# Patient Record
Sex: Female | Born: 1990 | Race: White | Hispanic: No | Marital: Single | Smoking: Current every day smoker
Health system: Southern US, Community
[De-identification: ages and names within clinical notes are randomized; demographics above are authoritative.]

## PROBLEM LIST (undated history)

## (undated) DIAGNOSIS — R519 Headache, unspecified: Secondary | ICD-10-CM

## (undated) DIAGNOSIS — R51 Headache: Secondary | ICD-10-CM

## (undated) DIAGNOSIS — K56609 Unspecified intestinal obstruction, unspecified as to partial versus complete obstruction: Secondary | ICD-10-CM

## (undated) HISTORY — PX: WISDOM TOOTH EXTRACTION: SHX21

---

## 2014-12-10 ENCOUNTER — Emergency Department (HOSPITAL_COMMUNITY)
Admission: EM | Admit: 2014-12-10 | Discharge: 2014-12-11 | Disposition: A | Discharge status: Home / Self Care | Payer: Self-pay | Attending: Emergency Medicine | Admitting: Emergency Medicine

## 2014-12-10 ENCOUNTER — Encounter (HOSPITAL_COMMUNITY): Payer: Self-pay | Admitting: Emergency Medicine

## 2014-12-10 DIAGNOSIS — K2920 Alcoholic gastritis without bleeding: Secondary | ICD-10-CM | POA: Insufficient documentation

## 2014-12-10 DIAGNOSIS — Z720 Tobacco use: Secondary | ICD-10-CM | POA: Insufficient documentation

## 2014-12-10 DIAGNOSIS — E860 Dehydration: Secondary | ICD-10-CM | POA: Insufficient documentation

## 2014-12-10 DIAGNOSIS — R740 Nonspecific elevation of levels of transaminase and lactic acid dehydrogenase [LDH]: Secondary | ICD-10-CM | POA: Insufficient documentation

## 2014-12-10 LAB — COMPREHENSIVE METABOLIC PANEL
ALK PHOS: 61 U/L (ref 38–126)
ALT: 31 U/L (ref 14–54)
AST: 39 U/L (ref 15–41)
Albumin: 4.2 g/dL (ref 3.5–5.0)
Anion gap: 17 — ABNORMAL HIGH (ref 5–15)
BILIRUBIN TOTAL: 1.3 mg/dL — AB (ref 0.3–1.2)
BUN: 9 mg/dL (ref 6–20)
CALCIUM: 9.3 mg/dL (ref 8.9–10.3)
CHLORIDE: 104 mmol/L (ref 101–111)
CO2: 16 mmol/L — ABNORMAL LOW (ref 22–32)
CREATININE: 0.71 mg/dL (ref 0.44–1.00)
Glucose, Bld: 100 mg/dL — ABNORMAL HIGH (ref 65–99)
Potassium: 4.4 mmol/L (ref 3.5–5.1)
Sodium: 137 mmol/L (ref 135–145)
TOTAL PROTEIN: 7.5 g/dL (ref 6.5–8.1)

## 2014-12-10 LAB — URINALYSIS, ROUTINE W REFLEX MICROSCOPIC
BILIRUBIN URINE: NEGATIVE
Glucose, UA: NEGATIVE mg/dL
Hgb urine dipstick: NEGATIVE
LEUKOCYTES UA: NEGATIVE
NITRITE: NEGATIVE
PROTEIN: NEGATIVE mg/dL
Specific Gravity, Urine: 1.023 (ref 1.005–1.030)
UROBILINOGEN UA: 0.2 mg/dL (ref 0.0–1.0)
pH: 6 (ref 5.0–8.0)

## 2014-12-10 LAB — LIPASE, BLOOD: LIPASE: 14 U/L — AB (ref 22–51)

## 2014-12-10 LAB — I-STAT CG4 LACTIC ACID, ED: LACTIC ACID, VENOUS: 2.24 mmol/L — AB (ref 0.5–2.0)

## 2014-12-10 LAB — CBC
HCT: 45 % (ref 36.0–46.0)
Hemoglobin: 15.6 g/dL — ABNORMAL HIGH (ref 12.0–15.0)
MCH: 31.9 pg (ref 26.0–34.0)
MCHC: 34.7 g/dL (ref 30.0–36.0)
MCV: 92 fL (ref 78.0–100.0)
PLATELETS: 309 10*3/uL (ref 150–400)
RBC: 4.89 MIL/uL (ref 3.87–5.11)
RDW: 12.4 % (ref 11.5–15.5)
WBC: 16.3 10*3/uL — AB (ref 4.0–10.5)

## 2014-12-10 LAB — I-STAT BETA HCG BLOOD, ED (MC, WL, AP ONLY): I-stat hCG, quantitative: <5 m[IU]/mL (ref <5)

## 2014-12-10 MED ORDER — FAMOTIDINE IN NACL 20-0.9 MG/50ML-% IV SOLN
20.0000 mg | Freq: Once | INTRAVENOUS | Status: AC
  Administered 2014-12-10: 20 mg via INTRAVENOUS
  Filled 2014-12-10: qty 50

## 2014-12-10 MED ORDER — HYDROMORPHONE HCL 1 MG/ML IJ SOLN
0.5000 mg | Freq: Once | INTRAMUSCULAR | Status: AC
  Administered 2014-12-10: 0.5 mg via INTRAVENOUS
  Filled 2014-12-10: qty 1

## 2014-12-10 MED ORDER — ONDANSETRON HCL 4 MG/2ML IJ SOLN
4.0000 mg | Freq: Once | INTRAMUSCULAR | Status: AC
  Administered 2014-12-10: 4 mg via INTRAVENOUS
  Filled 2014-12-10: qty 2

## 2014-12-10 MED ORDER — GI COCKTAIL ~~LOC~~
30.0000 mL | Freq: Once | ORAL | Status: AC
  Administered 2014-12-10: 30 mL via ORAL
  Filled 2014-12-10: qty 30

## 2014-12-10 MED ORDER — SODIUM CHLORIDE 0.9 % IV BOLUS (SEPSIS)
1000.0000 mL | Freq: Once | INTRAVENOUS | Status: AC
  Administered 2014-12-10: 1000 mL via INTRAVENOUS

## 2014-12-10 NOTE — ED Provider Notes (Addendum)
CSN: [id number]     Arrival date & time 12/10/14  2009 History   First MD Initiated Contact with Patient 12/10/14 2047     Chief Complaint  Patient presents with  . Abdominal Pain     (Consider location/radiation/quality/duration/timing/severity/associated sxs/prior Treatment) Patient is a 24 y.o. female presenting with abdominal pain. The history is provided by the patient.  Abdominal Pain Pain location:  Epigastric and periumbilical Pain quality: cramping, sharp and shooting   Pain radiates to:  Does not radiate Pain severity:  Severe Onset quality:  Sudden Duration:  12 hours Timing:  Constant Progression:  Worsening Chronicity:  New Context: alcohol use   Context comment:  Patient states she drinks heavily 4 times a week and last drank heavily last night Relieved by:  Nothing Worsened by:  Eating and vomiting Ineffective treatments: Patient went to urgent care earlier and at that time was given a shot of Phenergan without improvement in her pain. Associated symptoms: anorexia, nausea and vomiting   Associated symptoms: no diarrhea, no dysuria, no fever, no hematemesis, no hematuria, no shortness of breath, no vaginal bleeding and no vaginal discharge   Risk factors: alcohol abuse   Risk factors: not pregnant   Risk factors comment:  States she drinks heavily to the point of becoming inebriated 4 times a week   History reviewed. No pertinent past medical history. History reviewed. No pertinent past surgical history. No family history on file. Social History  Substance Use Topics  . Smoking status: Current Every Day Smoker  . Smokeless tobacco: None  . Alcohol Use: Yes   OB History    No data available     Review of Systems  Constitutional: Negative for fever.  Respiratory: Negative for shortness of breath.   Gastrointestinal: Positive for nausea, vomiting, abdominal pain and anorexia. Negative for diarrhea and hematemesis.  Genitourinary: Negative for dysuria,  hematuria, vaginal bleeding and vaginal discharge.  All other systems reviewed and are negative.     Allergies  Review of patient's allergies indicates not on file.  Home Medications   Prior to Admission medications   Not on File   BP 129/93 mmHg  Pulse 71  Temp(Src) 97.5 F (36.4 C) (Oral)  Resp 18  Ht  (1.676 m)  Wt 145 lb (65.772 kg)  BMI 23.41 kg/m2  SpO2 98%  LMP 11/12/2014 Physical Exam  Constitutional: She is oriented to person, place, and time. She appears well-developed and well-nourished. She appears distressed.  Appears uncomfortable  HENT:  Head: Normocephalic and atraumatic.  Mouth/Throat: Mucous membranes are dry.  Eyes: EOM are normal. Pupils are equal, round, and reactive to light.  Cardiovascular: Normal rate, regular rhythm, normal heart sounds and intact distal pulses.  Exam reveals no friction rub.   No murmur heard. Pulmonary/Chest: Effort normal and breath sounds normal. She has no wheezes. She has no rales. She exhibits no tenderness.  Abdominal: Soft. Bowel sounds are normal. She exhibits no distension. There is tenderness in the epigastric area and periumbilical area. There is no rebound and no guarding.    Musculoskeletal: Normal range of motion. She exhibits no tenderness.  No edema  Neurological: She is alert and oriented to person, place, and time. No cranial nerve deficit.  Skin: Skin is warm and dry. No rash noted.  Psychiatric: She has a normal mood and affect. Her behavior is normal.  Nursing note and vitals reviewed.   ED Course  Procedures (including critical care time) Labs Review Labs Reviewed  LIPASE, BLOOD - Abnormal; Notable for the following:    Lipase 14 (*)    All other components within normal limits  COMPREHENSIVE METABOLIC PANEL - Abnormal; Notable for the following:    CO2 16 (*)    Glucose, Bld 100 (*)    Total Bilirubin 1.3 (*)    Anion gap 17 (*)    All other components within normal limits  CBC -  Abnormal; Notable for the following:    WBC 16.3 (*)    Hemoglobin 15.6 (*)    All other components within normal limits  URINALYSIS, ROUTINE W REFLEX MICROSCOPIC (NOT AT [facility]) - Abnormal; Notable for the following:    APPearance CLOUDY (*)    Ketones, ur >80 (*)    All other components within normal limits  I-STAT CG4 LACTIC ACID, ED - Abnormal; Notable for the following:    Lactic Acid, Venous 2.24 (*)    All other components within normal limits  I-STAT BETA HCG BLOOD, ED ([facility], [facility], [facility] ONLY)    Imaging Review No results found. I have personally reviewed and evaluated these images and lab results as part of my medical decision-making.   EKG Interpretation None      MDM   Final diagnoses:  Alcoholic gastritis  Dehydration   Patient is a 24 year old female with a history of IBS but no other acute medical problems who takes no medications on a regular basis and presents today with severe epigastric and umbilical pain that's been ongoing now for almost 12 hours. She drinks heavily on a regular basis and last drank numerous drinks last night prior to going to bed. She has had vomiting but no diarrhea. No fever. Patient was seen in urgent care and then Phenergan and had an ultrasound and x-ray which they do not know what the results were. Also had blood work and also results were unknown. Patient went home but continued to have abdominal pain and returned here. She is hemodynamically stable however lactate is slightly elevated at 2.24. She does appear dehydrated and concern for alcoholic pancreatitis versus gastritis. She is given IV fluids, pain and nausea medication as well as Pepcid.  Low suspicion for appendicitis, cholecystitis, ectopic pregnancy, ovarian torsion, renal stone.  CBC, CMP, lipase, UA, hCG pending   10:13 PM Leukocytosis of 16,000 which is most likely stress reaction, normal CMP, lipase and hCG. Patient appears to be dehydrated but otherwise has normal labs. Feel  most likely this is alcoholic gastritis. On reevaluation patient is feeling much better. She is requesting something to drink. Will give a second liter and patient was going to start Prilosec 2 tablets per day for 2 weeks.  12:03 AM Pt toleratling po's and feeling much better.  D/ced home with carafate and zofran.    Gwyneth Sprout, MD 12/11/14 7741  Gwyneth Sprout, MD 12/11/14 4239

## 2014-12-10 NOTE — ED Notes (Signed)
C/o sharp mid abd pain, abd distention, nausea, vomiting, and diarrhea since noon today.  Denies urinary complaints.  Seen at [facility] today and given Rx for phenergan.

## 2014-12-11 MED ORDER — ONDANSETRON 8 MG PO TBDP: 8.0000 mg | ORAL_TABLET | Freq: Three times a day (TID) | ORAL | Status: AC | PRN

## 2014-12-11 MED ORDER — HYDROCODONE-ACETAMINOPHEN 5-325 MG PO TABS: 1.0000 | ORAL_TABLET | ORAL | Status: DC | PRN

## 2014-12-11 MED ORDER — SUCRALFATE 1 G PO TABS: 1.0000 g | ORAL_TABLET | Freq: Three times a day (TID) | ORAL | Status: DC

## 2014-12-11 MED ORDER — HYDROMORPHONE HCL 1 MG/ML IJ SOLN
0.5000 mg | Freq: Once | INTRAMUSCULAR | Status: AC
  Administered 2014-12-11: 0.5 mg via INTRAVENOUS
  Filled 2014-12-11: qty 1

## 2016-07-12 ENCOUNTER — Inpatient Hospital Stay (HOSPITAL_COMMUNITY)
Admission: EM | Admit: 2016-07-12 | Discharge: 2016-07-15 | DRG: 390 | Disposition: A | Discharge status: Home / Self Care | Payer: No Typology Code available for payment source | Attending: Family Medicine | Admitting: Family Medicine

## 2016-07-12 ENCOUNTER — Encounter (HOSPITAL_COMMUNITY): Payer: Self-pay | Admitting: Emergency Medicine

## 2016-07-12 ENCOUNTER — Emergency Department (HOSPITAL_COMMUNITY): Payer: No Typology Code available for payment source

## 2016-07-12 ENCOUNTER — Inpatient Hospital Stay (HOSPITAL_COMMUNITY): Payer: No Typology Code available for payment source

## 2016-07-12 DIAGNOSIS — D72829 Elevated white blood cell count, unspecified: Secondary | ICD-10-CM | POA: Diagnosis present

## 2016-07-12 DIAGNOSIS — N760 Acute vaginitis: Secondary | ICD-10-CM | POA: Diagnosis not present

## 2016-07-12 DIAGNOSIS — Z0189 Encounter for other specified special examinations: Secondary | ICD-10-CM | POA: Diagnosis not present

## 2016-07-12 DIAGNOSIS — E860 Dehydration: Secondary | ICD-10-CM | POA: Diagnosis present

## 2016-07-12 DIAGNOSIS — K565 Intestinal adhesions [bands], unspecified as to partial versus complete obstruction: Principal | ICD-10-CM | POA: Diagnosis present

## 2016-07-12 DIAGNOSIS — R1110 Vomiting, unspecified: Secondary | ICD-10-CM | POA: Diagnosis present

## 2016-07-12 DIAGNOSIS — K56609 Unspecified intestinal obstruction, unspecified as to partial versus complete obstruction: Secondary | ICD-10-CM

## 2016-07-12 DIAGNOSIS — Z79899 Other long term (current) drug therapy: Secondary | ICD-10-CM | POA: Diagnosis not present

## 2016-07-12 HISTORY — DX: Unspecified intestinal obstruction, unspecified as to partial versus complete obstruction: K56.609

## 2016-07-12 LAB — CBC
HCT: 43.8 % (ref 36.0–46.0)
Hemoglobin: 14.5 g/dL (ref 12.0–15.0)
MCH: 29.6 pg (ref 26.0–34.0)
MCHC: 33.1 g/dL (ref 30.0–36.0)
MCV: 89.4 fL (ref 78.0–100.0)
Platelets: 363 10*3/uL (ref 150–400)
RBC: 4.9 MIL/uL (ref 3.87–5.11)
RDW: 12.6 % (ref 11.5–15.5)
WBC: 12.7 10*3/uL — ABNORMAL HIGH (ref 4.0–10.5)

## 2016-07-12 LAB — WET PREP, GENITAL
CLUE CELLS WET PREP: NONE SEEN
Sperm: NONE SEEN
TRICH WET PREP: NONE SEEN
Yeast Wet Prep HPF POC: NONE SEEN

## 2016-07-12 LAB — COMPREHENSIVE METABOLIC PANEL
ALK PHOS: 67 U/L (ref 38–126)
ALT: 47 U/L (ref 14–54)
AST: 32 U/L (ref 15–41)
Albumin: 3.9 g/dL (ref 3.5–5.0)
Anion gap: 10 (ref 5–15)
BUN: 11 mg/dL (ref 6–20)
CALCIUM: 9.4 mg/dL (ref 8.9–10.3)
CO2: 23 mmol/L (ref 22–32)
CREATININE: 0.64 mg/dL (ref 0.44–1.00)
Chloride: 104 mmol/L (ref 101–111)
GFR calc Af Amer: >60 mL/min (ref >60)
Glucose, Bld: 110 mg/dL — ABNORMAL HIGH (ref 65–99)
Potassium: 4 mmol/L (ref 3.5–5.1)
SODIUM: 137 mmol/L (ref 135–145)
Total Bilirubin: 0.7 mg/dL (ref 0.3–1.2)
Total Protein: 7.6 g/dL (ref 6.5–8.1)

## 2016-07-12 LAB — URINALYSIS, ROUTINE W REFLEX MICROSCOPIC
BILIRUBIN URINE: NEGATIVE
GLUCOSE, UA: NEGATIVE mg/dL
HGB URINE DIPSTICK: NEGATIVE
KETONES UR: 80 mg/dL — AB
Leukocytes, UA: NEGATIVE
Nitrite: NEGATIVE
PROTEIN: NEGATIVE mg/dL
Specific Gravity, Urine: 1.027 (ref 1.005–1.030)
pH: 5 (ref 5.0–8.0)

## 2016-07-12 LAB — I-STAT BETA HCG BLOOD, ED (MC, WL, AP ONLY)

## 2016-07-12 LAB — MAGNESIUM: Magnesium: 1.8 mg/dL (ref 1.7–2.4)

## 2016-07-12 LAB — LACTIC ACID, PLASMA: Lactic Acid, Venous: 1 mmol/L (ref 0.5–1.9)

## 2016-07-12 LAB — LIPASE, BLOOD: Lipase: 13 U/L (ref 11–51)

## 2016-07-12 MED ORDER — KCL IN DEXTROSE-NACL 20-5-0.9 MEQ/L-%-% IV SOLN
INTRAVENOUS | Status: DC
  Administered 2016-07-12 – 2016-07-15 (×5): via INTRAVENOUS
  Filled 2016-07-12 (×9): qty 1000

## 2016-07-12 MED ORDER — DIPHENHYDRAMINE HCL 50 MG/ML IJ SOLN
25.0000 mg | Freq: Once | INTRAMUSCULAR | Status: AC
  Administered 2016-07-12: 25 mg via INTRAVENOUS
  Filled 2016-07-12: qty 1

## 2016-07-12 MED ORDER — MORPHINE SULFATE (PF) 4 MG/ML IV SOLN: 4.0000 mg | Freq: Once | INTRAVENOUS | Status: DC

## 2016-07-12 MED ORDER — ONDANSETRON HCL 4 MG/2ML IJ SOLN
4.0000 mg | Freq: Four times a day (QID) | INTRAMUSCULAR | Status: DC | PRN
  Administered 2016-07-12 – 2016-07-14 (×3): 4 mg via INTRAVENOUS
  Filled 2016-07-12 (×3): qty 2

## 2016-07-12 MED ORDER — HYDROMORPHONE HCL 1 MG/ML IJ SOLN
0.5000 mg | Freq: Once | INTRAMUSCULAR | Status: AC
  Administered 2016-07-12: 0.5 mg via INTRAVENOUS
  Filled 2016-07-12: qty 1

## 2016-07-12 MED ORDER — SODIUM CHLORIDE 0.9 % IV BOLUS (SEPSIS)
1000.0000 mL | Freq: Once | INTRAVENOUS | Status: AC
  Administered 2016-07-12: 1000 mL via INTRAVENOUS

## 2016-07-12 MED ORDER — FENTANYL CITRATE (PF) 100 MCG/2ML IJ SOLN
25.0000 ug | INTRAMUSCULAR | Status: DC | PRN
  Administered 2016-07-12 – 2016-07-13 (×2): 25 ug via INTRAVENOUS
  Filled 2016-07-12 (×2): qty 2

## 2016-07-12 MED ORDER — DIATRIZOATE MEGLUMINE & SODIUM 66-10 % PO SOLN
90.0000 mL | Freq: Once | ORAL | Status: AC
  Administered 2016-07-12: 90 mL via NASOGASTRIC
  Filled 2016-07-12 (×2): qty 90

## 2016-07-12 MED ORDER — ONDANSETRON HCL 4 MG PO TABS: 4.0000 mg | ORAL_TABLET | Freq: Four times a day (QID) | ORAL | Status: DC | PRN

## 2016-07-12 MED ORDER — MORPHINE SULFATE (PF) 4 MG/ML IV SOLN
4.0000 mg | Freq: Once | INTRAVENOUS | Status: AC
  Administered 2016-07-12: 4 mg via INTRAVENOUS
  Filled 2016-07-12: qty 1

## 2016-07-12 MED ORDER — KETOROLAC TROMETHAMINE 15 MG/ML IJ SOLN
15.0000 mg | Freq: Four times a day (QID) | INTRAMUSCULAR | Status: DC | PRN
  Administered 2016-07-12 – 2016-07-14 (×2): 15 mg via INTRAVENOUS
  Filled 2016-07-12 (×2): qty 1

## 2016-07-12 MED ORDER — ONDANSETRON HCL 4 MG/2ML IJ SOLN
4.0000 mg | Freq: Once | INTRAMUSCULAR | Status: AC
  Administered 2016-07-12: 4 mg via INTRAVENOUS
  Filled 2016-07-12: qty 2

## 2016-07-12 MED ORDER — METRONIDAZOLE IN NACL 5-0.79 MG/ML-% IV SOLN: 500.0000 mg | Freq: Three times a day (TID) | INTRAVENOUS | Status: DC

## 2016-07-12 MED ORDER — ENOXAPARIN SODIUM 40 MG/0.4ML ~~LOC~~ SOLN
40.0000 mg | SUBCUTANEOUS | Status: DC
  Administered 2016-07-12 – 2016-07-13 (×2): 40 mg via SUBCUTANEOUS
  Filled 2016-07-12 (×2): qty 0.4

## 2016-07-12 MED ORDER — IOPAMIDOL (ISOVUE-300) INJECTION 61%
INTRAVENOUS | Status: AC
  Administered 2016-07-12: 100 mL
  Filled 2016-07-12: qty 100

## 2016-07-12 MED ORDER — LIDOCAINE VISCOUS 2 % MT SOLN
15.0000 mL | Freq: Once | OROMUCOSAL | Status: DC
  Filled 2016-07-12: qty 15

## 2016-07-12 NOTE — Progress Notes (Signed)
Pt admitted to the unit at 1652. Pt mental status is A&O x 4. Pt oriented to room, staff, and call bell. Skin is intact except where otherwise charted. Full assessment charted in [facility]. Call bell within reach. Visitor guidelines reviewed w/ pt and/or family.

## 2016-07-12 NOTE — ED Notes (Signed)
Pt taken to CT; no itching at injection sight as before with morphine. Instructed to let staff know if having any allergic reaction symptoms

## 2016-07-12 NOTE — ED Notes (Signed)
Pt. Ambulated to the bathroom, gait steady.  

## 2016-07-12 NOTE — ED Triage Notes (Signed)
Pt states at 12am today she started having generalized abdominal cramping and pain. Pt reports drinking 3 times a week beer and liquor when asked how much pt states, "more than she should".

## 2016-07-12 NOTE — ED Notes (Signed)
Report attempted x 1

## 2016-07-12 NOTE — ED Notes (Signed)
Pt tolerating NG tube well; no needs at this time; RN provided education on her NPO status and why important.

## 2016-07-12 NOTE — ED Notes (Signed)
MD informed pt has recurrence of pain.

## 2016-07-12 NOTE — ED Provider Notes (Signed)
[facility]-EMERGENCY DEPT Provider Note   CSN: [id number] Arrival date & time: 07/12/16  8756     History   Chief Complaint Chief Complaint  Patient presents with  . Abdominal Pain    HPI Stephanie Ware is a 26 y.o. female.  HPI Pt complains of abdominal pain.   She did drink a lot of alcohol on Tuesday.  However she felt fine most of Wednesday until last night around midnight.  The pain is coming and going.  Whenever she tried to eat or drink she vomits.  The pain is severe at times and all over her abdomen.  Some loose stools but not a lot.  She has vomited numerous times.  No abdominal surgeries. Normal menses.   History reviewed. No pertinent past medical history.  Patient Active Problem List   Diagnosis Date Noted  . SBO (small bowel obstruction) 07/12/2016  . Dehydration 07/12/2016  . Bacterial vaginosis 07/12/2016    History reviewed. No pertinent surgical history.  OB History    No data available       Home Medications    Prior to Admission medications   Medication Sig Start Date End Date Taking? Authorizing Provider  Loratadine (CLARITIN) 10 MG CAPS Take 10 mg by mouth daily.   Yes Historical Provider, MD  norethindrone-ethinyl estradiol-iron (MICROGESTIN FE,GILDESS FE,LOESTRIN FE) 1.5-30 MG-MCG tablet Take 1 tablet by mouth daily.   Yes Historical Provider, MD  Omega-3 Fatty Acids (FISH OIL) 1000 MG CPDR Take 1,000 mg by mouth daily.   Yes Historical Provider, MD  omeprazole (PRILOSEC) 40 MG capsule Take 40 mg by mouth daily.    Yes Historical Provider, MD  HYDROcodone-acetaminophen (NORCO/VICODIN) 5-325 MG per tablet Take 1-2 tablets by mouth every 4 (four) hours as needed. Patient not taking: Reported on 07/12/2016 12/11/14   Gwyneth Sprout, MD  ondansetron (ZOFRAN ODT) 8 MG disintegrating tablet Take 1 tablet (8 mg total) by mouth every 8 (eight) hours as needed for nausea or vomiting. Patient not taking: Reported on 07/12/2016 12/11/14   Gwyneth Sprout, MD    sucralfate (CARAFATE) 1 G tablet Take 1 tablet (1 g total) by mouth 4 (four) times daily -  with meals and at bedtime. Patient not taking: Reported on 07/12/2016 12/11/14   Gwyneth Sprout, MD    Family History No family history on file.  Social History Social History  Substance Use Topics  . Smoking status: Current Every Day Smoker  . Smokeless tobacco: Not on file  . Alcohol use Yes     Allergies   Patient has no known allergies.   Review of Systems Review of Systems  Constitutional: Negative for fever.  Gastrointestinal: Negative for abdominal pain and vomiting.  All other systems reviewed and are negative.    Physical Exam Updated Vital Signs BP 113/69   Pulse 87   Temp 98 F (36.7 C) (Oral)   Resp 17   Ht 5\' 6"  (1.676 m)   Wt 65.8 kg   LMP 06/28/2016   SpO2 100%   BMI 23.40 kg/m   Physical Exam  Constitutional: She appears well-developed and well-nourished. No distress.  HENT:  Head: Normocephalic and atraumatic.  Right Ear: External ear normal.  Left Ear: External ear normal.  Eyes: Conjunctivae are normal. Right eye exhibits no discharge. Left eye exhibits no discharge. No scleral icterus.  Neck: Neck supple. No tracheal deviation present.  Cardiovascular: Normal rate, regular rhythm and intact distal pulses.   Pulmonary/Chest: Effort normal and breath sounds normal. No  stridor. No respiratory distress. She has no wheezes. She has no rales.  Abdominal: Soft. Bowel sounds are normal. She exhibits no distension. There is tenderness in the suprapubic area. There is no rebound and no guarding.  Genitourinary: Uterus normal. Pelvic exam was performed with patient supine. There is no rash, tenderness, lesion or injury on the right labia. There is no rash, tenderness, lesion or injury on the left labia. Uterus is not tender. Cervix exhibits no motion tenderness, no discharge and no friability. Right adnexum displays no mass, no tenderness and no fullness. Left  adnexum displays no mass, no tenderness and no fullness. No erythema or tenderness in the vagina. No foreign body in the vagina. No signs of injury around the vagina. No vaginal discharge found.  Musculoskeletal: She exhibits no edema or tenderness.  Neurological: She is alert. She has normal strength. No cranial nerve deficit (no facial droop, extraocular movements intact, no slurred speech) or sensory deficit. She exhibits normal muscle tone. She displays no seizure activity. Coordination normal.  Skin: Skin is warm and dry. No rash noted.  Psychiatric: She has a normal mood and affect.  Nursing note and vitals reviewed.    ED Treatments / Results  Labs (all labs ordered are listed, but only abnormal results are displayed) Labs Reviewed  WET PREP, GENITAL - Abnormal; Notable for the following:       Result Value   WBC, Wet Prep HPF POC MANY (*)    All other components within normal limits  COMPREHENSIVE METABOLIC PANEL - Abnormal; Notable for the following:    Glucose, Bld 110 (*)    All other components within normal limits  CBC - Abnormal; Notable for the following:    WBC 12.7 (*)    All other components within normal limits  URINALYSIS, ROUTINE W REFLEX MICROSCOPIC - Abnormal; Notable for the following:    Ketones, ur 80 (*)    All other components within normal limits  LIPASE, BLOOD  RPR  HIV ANTIBODY (ROUTINE TESTING)  I-STAT BETA HCG BLOOD, ED ([facility], [facility], [facility] ONLY)  GC/CHLAMYDIA PROBE AMP ([place]) NOT AT [facility]    Radiology Ct Abdomen Pelvis W Contrast  Result Date: 07/12/2016 CLINICAL DATA:  Abdominal distention, cramps and belching for 1 day. EXAM: CT ABDOMEN AND PELVIS WITH CONTRAST TECHNIQUE: Multidetector CT imaging of the abdomen and pelvis was performed using the standard protocol following bolus administration of intravenous contrast. CONTRAST:  100 ml ISOVUE-300 IOPAMIDOL (ISOVUE-300) INJECTION 61% COMPARISON:  None. FINDINGS: Lower chest: Lung bases are clear. No  pleural or pericardial effusion. Hepatobiliary: No focal liver abnormality is seen. No gallstones, gallbladder wall thickening, or biliary dilatation. Pancreas: Unremarkable. No pancreatic ductal dilatation or surrounding inflammatory changes. Spleen: Normal in size without focal abnormality. Adrenals/Urinary Tract: Adrenal glands are unremarkable. Kidneys are normal, without renal calculi, focal lesion, or hydronephrosis. Bladder is unremarkable. Stomach/Bowel: There is dilatation of small bowel up to 3 cm with air-fluid levels present. Fecalized small bowel contents are seen in the right lower quadrant where a transition point is identified. Loops distal to the transition are completely decompressed. There is marked angulation about the transition point suggestive of adhesions. The terminal ileum is normal in appearance. The colon is largely decompressed but otherwise appears normal. The stomach and appendix are normal. No pneumatosis, portal venous gas or free intraperitoneal air is identified. Vascular/Lymphatic: No significant vascular findings are present. No enlarged abdominal or pelvic lymph nodes. Reproductive: Uterus and bilateral adnexa are unremarkable. Other: Small  volume of free pelvic fluid is noted. The patient has a small umbilical hernia. Musculoskeletal: Appear normal. IMPRESSION: The study is positive for small bowel obstruction with a transition point identified in the right lower quadrant. The appearance of the transition point is suggestive of adhesions. No CT evidence of inflammatory bowel disease is identified. The study is otherwise negative. Electronically Signed   By: Drusilla Kanner M.D.   On: 07/12/2016 12:19    Procedures Procedures (including critical care time)  Medications Ordered in ED Medications  lidocaine (XYLOCAINE) 2 % viscous mouth solution 15 mL (not administered)  dextrose 5 % and 0.9 % NaCl with KCl 20 mEq/L infusion (not administered)  metroNIDAZOLE (FLAGYL)  IVPB 500 mg (not administered)  diatrizoate meglumine-sodium (GASTROGRAFIN) 66-10 % solution 90 mL (not administered)  sodium chloride 0.9 % bolus 1,000 mL (0 mLs Intravenous Stopped 07/12/16 1144)  morphine 4 MG/ML injection 4 mg (4 mg Intravenous Given 07/12/16 1028)  ondansetron (ZOFRAN) injection 4 mg (4 mg Intravenous Given 07/12/16 1028)  diphenhydrAMINE (BENADRYL) injection 25 mg (25 mg Intravenous Given 07/12/16 1045)  iopamidol (ISOVUE-300) 61 % injection (100 mLs  Contrast Given 07/12/16 1155)  HYDROmorphone (DILAUDID) injection 0.5 mg (0.5 mg Intravenous Given 07/12/16 1147)     Initial Impression / Assessment and Plan / ED Course  I have reviewed the triage vital signs and the nursing notes.  Pertinent labs & imaging results that were available during my care of the patient were reviewed by me and considered in my medical decision making (see chart for details).  Clinical Course as of Jul 12 1312  Thu Jul 12, 2016  1038 Pt is having a localized reaction to the morphine.  Will give dose of benadryl  [JK]  1117 Pt has an elevated WBC count and lower abdominal ttp.  No acute findings on pelvic exam.  Appendicitis is a concern.  Will ct to evaluate further  [JK]  1313 NG tube ordered  [JK]  1314 Spoke with Revonda Standard, triad hospitalist  [JK]    Clinical Course User Index [JK] Linwood Dibbles, MD   Patient's CT scan shows a small bowel obstruction.  She denies any prior abdominal surgeries.  She denies any history of PID. I'm not certain what has caused the adhesion triggering this small bowel obstruction.  Plan on NG tube, admission to the hospital for further treatment.  Final Clinical Impressions(s) / ED Diagnoses   Final diagnoses:  Small bowel obstruction      Linwood Dibbles, MD 07/12/16 1314

## 2016-07-12 NOTE — ED Notes (Signed)
Xray at bedside to confirm NG placement.

## 2016-07-12 NOTE — H&P (Signed)
History and Physical    Stephanie Ware ZOX:[id number] DOB: November 27, 1990 DOA: 07/12/2016   PCP: No PCP Per Patient Gentry Fitz  Patient coming from/Resides with: Private residence  Admission status: Inpatient/floor-medically necessary to stay a minimum 2 midnights to rule out impending and/or unexpected changes in physiologic status that may differ from initial evaluation performed in the ER and/or at time of admission. Presents with intractable nausea and vomiting and crampy abdominal pain with CT revealing small bowel obstruction with RLQ transition zone. Patient will require bowel rest,NPO status, IV fluids and NGT to LWS.  Chief Complaint: Nausea/vomiting with colicky abdominal pain  HPI: Stephanie Ware is a 26 y.o. female with medical history significant for episodic alcoholic gastritis in 2016. Patient reports that around midnight she awakened with crampy abdominal pain and every attempt to drink fluids resulted in large volume emesis that was nonbloody. She's not had any flatus or had a bowel movement since onset of symptoms. She has never had a bowel obstruction before.  ED Course:  Vital Signs: BP 113/69   Pulse 87   Temp 98 F (36.7 C) (Oral)   Resp 17   Ht 5\' 6"  (1.676 m)   Wt 65.8 kg (145 lb)   LMP 06/28/2016   SpO2 100%   BMI 23.40 kg/m  CT abdomen/pelvis w/ contrast: SBO w/ TZ in RLQ-suggestive of lesions-no CT evidence of IBD Lab data: Na 137, K 4.0, Cl 104, CO2 23, Gluc 110, BUN 11, Cr 0.64, WBCs 12,700, hGB 14.5, platelets 363,000, wet prep with many WBCs o/w neg, urinalysis unremarkable except for 80 ketones and specific gravity 1.027, I-stat hcg <5, GC/Chlamydia probe pending Medications and treatments: NS bolus 1 L, morphine 4 mg IV 1, Zofran 4 mg IV 1, Benadryl 25 mg IV 1, Dilaudid 0.5 mg IV 1  Review of Systems:  In addition to the HPI above,  No Fever-chills, myalgias or other constitutional symptoms No Headache, changes with Vision or hearing, new weakness,  tingling, numbness in any extremity, dizziness, dysarthria or word finding difficulty, gait disturbance or imbalance, tremors or seizure activity No problems swallowing food or Liquids, indigestion/reflux, choking or coughing while eating No Chest pain, Cough or Shortness of Breath, palpitations, orthopnea or DOE No melena,hematochezia, dark tarry stools, constipation No dysuria, malodorous urine, hematuria or flank pain No new skin rashes, lesions, masses or bruises, No new joint pains, aches, swelling or redness No recent unintentional weight gain or loss No polyuria, polydypsia or polyphagia   History reviewed. No pertinent past medical history.  History reviewed. No pertinent surgical history.  Social History   Social History  . Marital status: Single    Spouse name: N/A  . Number of children: N/A  . Years of education: N/A   Occupational History  . Not on file.   Social History Main Topics  . Smoking status: Current Every Day Smoker  . Smokeless tobacco: Not on file  . Alcohol use Yes  . Drug use: No  . Sexual activity: Not on file   Other Topics Concern  . Not on file   Social History Narrative  . 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 Authorizing Provider  Loratadine (CLARITIN) 10 MG CAPS Take 10 mg by mouth  daily.   Yes Historical Provider, MD  norethindrone-ethinyl estradiol-iron (MICROGESTIN FE,GILDESS FE,LOESTRIN FE) 1.5-30 MG-MCG tablet Take 1 tablet by mouth daily.   Yes Historical Provider, MD  Omega-3 Fatty Acids (FISH OIL) 1000 MG CPDR Take 1,000 mg by mouth daily.   Yes Historical Provider, MD  omeprazole (PRILOSEC) 40 MG capsule Take 40 mg by mouth daily.    Yes Historical  Provider, MD  HYDROcodone-acetaminophen (NORCO/VICODIN) 5-325 MG per tablet Take 1-2 tablets by mouth every 4 (four) hours as needed. Patient not taking: Reported on 07/12/2016 12/11/14   Gwyneth Sprout, MD  ondansetron (ZOFRAN ODT) 8 MG disintegrating tablet Take 1 tablet (8 mg total) by mouth every 8 (eight) hours as needed for nausea or vomiting. Patient not taking: Reported on 07/12/2016 12/11/14   Gwyneth Sprout, MD  sucralfate (CARAFATE) 1 G tablet Take 1 tablet (1 g total) by mouth 4 (four) times daily -  with meals and at bedtime. Patient not taking: Reported on 07/12/2016 12/11/14   Gwyneth Sprout, MD    Physical Exam: Vitals:   07/12/16 0750 07/12/16 1100 07/12/16 1227 07/12/16 1248  BP:  116/66 113/69 113/69  Pulse:  60 83 87  Resp:    17  Temp:      TempSrc:      SpO2:  100% 100% 100%  Weight: 65.8 kg (145 lb)     Height: 5\' 6"  (1.676 m)         Constitutional: NAD, calm, uncomfortable 2/2 ongoing crampy abdominal pain and attempts to place nasogastric tube Eyes: PERRL, lids and conjunctivae normal ENMT: Mucous membranes are dry. Posterior pharynx clear of any exudate or lesions.Normal dentition.  Neck: normal, supple, no masses, no thyromegaly Respiratory: clear to auscultation bilaterally, no wheezing, no crackles. Normal respiratory effort. No accessory muscle use.  Cardiovascular: Regular rate and rhythm, no murmurs / rubs / gallops. No extremity edema. 2+ pedal pulses. No carotid bruits.  Abdomen: Diffusely minimally tender down guarding or rebounding, no masses palpated. No hepatosplenomegaly. Bowel sounds absent-abdomen slightly distended non-tympanitic.  Musculoskeletal: no clubbing / cyanosis. No joint deformity upper and lower extremities. Good ROM, no contractures. Normal muscle tone.  Skin: no rashes, lesions, ulcers. No induration Neurologic: CN 2-12 grossly intact. Sensation intact, DTR normal. Strength 5/5 x all 4 extremities.  Psychiatric: Normal judgment and  insight. Alert and oriented x 3. Normal mood.    Labs on Admission: I have personally reviewed following labs and imaging studies  CBC:  Recent Labs Lab 07/12/16 0754  WBC 12.7*  HGB 14.5  HCT 43.8  MCV 89.4  PLT 363   Basic Metabolic Panel:  Recent Labs Lab 07/12/16 0754  NA 137  K 4.0  CL 104  CO2 23  GLUCOSE 110*  BUN 11  CREATININE 0.64  CALCIUM 9.4   GFR: Estimated Creatinine Clearance: 101.5 mL/min (by C-G formula based on SCr of 0.64 mg/dL). Liver Function Tests:  Recent Labs Lab 07/12/16 0754  AST 32  ALT 47  ALKPHOS 67  BILITOT 0.7  PROT 7.6  ALBUMIN 3.9    Recent Labs Lab 07/12/16 0754  LIPASE 13   No results for input(s): AMMONIA in the last 168 hours. Coagulation Profile: No results for input(s): INR, PROTIME in the last 168 hours. Cardiac Enzymes: No results for input(s): CKTOTAL, CKMB, CKMBINDEX, TROPONINI in the last 168 hours. BNP (last 3 results) No results for input(s): PROBNP in the last 8760 hours. HbA1C: No results for input(s): HGBA1C in the last 72 hours.  CBG: No results for input(s): GLUCAP in the last 168 hours. Lipid Profile: No results for input(s): CHOL, HDL, LDLCALC, TRIG, CHOLHDL, LDLDIRECT in the last 72 hours. Thyroid Function Tests: No results for input(s): TSH, T4TOTAL, FREET4, T3FREE, THYROIDAB in the last 72 hours. Anemia Panel: No results for input(s): VITAMINB12, FOLATE, FERRITIN, TIBC, IRON, RETICCTPCT in the last 72 hours. Urine analysis:    Component Value Date/Time   COLORURINE YELLOW 07/12/2016 1019   APPEARANCEUR CLEAR 07/12/2016 1019   LABSPEC 1.027 07/12/2016 1019   PHURINE 5.0 07/12/2016 1019   GLUCOSEU NEGATIVE 07/12/2016 1019   HGBUR NEGATIVE 07/12/2016 1019   BILIRUBINUR NEGATIVE 07/12/2016 1019   KETONESUR 80 (A) 07/12/2016 1019   PROTEINUR NEGATIVE 07/12/2016 1019   UROBILINOGEN 0.2 12/10/2014 2041   NITRITE NEGATIVE 07/12/2016 1019   LEUKOCYTESUR NEGATIVE 07/12/2016 1019   Sepsis  Labs: @LABRCNTIP (procalcitonin:4,lacticidven:4) ) Recent Results (from the past 240 hour(s))  Wet prep, genital     Status: Abnormal   Collection Time: 07/12/16 11:17 AM  Result Value Ref Range Status   Yeast Wet Prep HPF POC NONE SEEN NONE SEEN Final   Trich, Wet Prep NONE SEEN NONE SEEN Final   Clue Cells Wet Prep HPF POC NONE SEEN NONE SEEN Final   WBC, Wet Prep HPF POC MANY (A) NONE SEEN Final   Sperm NONE SEEN  Final     Radiological Exams on Admission: Ct Abdomen Pelvis W Contrast  Result Date: 07/12/2016 CLINICAL DATA:  Abdominal distention, cramps and belching for 1 day. EXAM: CT ABDOMEN AND PELVIS WITH CONTRAST TECHNIQUE: Multidetector CT imaging of the abdomen and pelvis was performed using the standard protocol following bolus administration of intravenous contrast. CONTRAST:  100 ml ISOVUE-300 IOPAMIDOL (ISOVUE-300) INJECTION 61% COMPARISON:  None. FINDINGS: Lower chest: Lung bases are clear. No pleural or pericardial effusion. Hepatobiliary: No focal liver abnormality is seen. No gallstones, gallbladder wall thickening, or biliary dilatation. Pancreas: Unremarkable. No pancreatic ductal dilatation or surrounding inflammatory changes. Spleen: Normal in size without focal abnormality. Adrenals/Urinary Tract: Adrenal glands are unremarkable. Kidneys are normal, without renal calculi, focal lesion, or hydronephrosis. Bladder is unremarkable. Stomach/Bowel: There is dilatation of small bowel up to 3 cm with air-fluid levels present. Fecalized small bowel contents are seen in the right lower quadrant where a transition point is identified. Loops distal to the transition are completely decompressed. There is marked angulation about the transition point suggestive of adhesions. The terminal ileum is normal in appearance. The colon is largely decompressed but otherwise appears normal. The stomach and appendix are normal. No pneumatosis, portal venous gas or free intraperitoneal air is identified.  Vascular/Lymphatic: No significant vascular findings are present. No enlarged abdominal or pelvic lymph nodes. Reproductive: Uterus and bilateral adnexa are unremarkable. Other: Small volume of free pelvic fluid is noted. The patient has a small umbilical hernia. Musculoskeletal: Appear normal. IMPRESSION: The study is positive for small bowel obstruction with a transition point identified in the right lower quadrant. The appearance of the transition point is suggestive of adhesions. No CT evidence of inflammatory bowel disease is identified. The study is otherwise negative. Electronically Signed   By: Drusilla Kanner M.D.   On: 07/12/2016 12:19     Assessment/Plan Principal Problem:   SBO (small bowel obstruction) -Patient presents with recalcitrant nausea/ vomiting with crampy abdominal pain with CT findings consistent with SBO w/ focal transition zone in the RLQ -Interestingly patient has no prior surgical history to explain postulated adhesions as etiology -Denies symptoms of inflammatory  bowel disease, prior PID, previous diagnosis of endometriosis or painful periods suggestive of endometriosis -NGT to LWS-utilize SBO order set -Serial abdominal films post administration of NG contrast per protocol -NPO -D5NS w/ 20 meq KCL @ 125/hr -Toradol initially for pain-avoid narcotics if possible -Zofran for nausea -Encourage mobilization  Active Problems:   Dehydration -IV fluids as above -Electrolyte panel in a.m.    ?? Vaginosis -Crampy lower abdominal pain likely explained by bowel obstruction -Pelvic exam in ER unremarkable except for WBCs on wet prep -Follow-up on GC/chlamydia probe -Of note no vaginal discharge noted on exam by EDP      DVT prophylaxis: Lovenox Code Status: Full Family Communication: Female friend at bedside  Disposition Plan: Home Consults called: None     ELLIS,ALLISON L. ANP-BC [facility] Pager [phone]   If 7PM-7AM, please contact  night-coverage [email or web address] Password [email or web address]  07/12/2016, 1:34 PM

## 2016-07-13 ENCOUNTER — Inpatient Hospital Stay (HOSPITAL_COMMUNITY): Payer: No Typology Code available for payment source

## 2016-07-13 DIAGNOSIS — Z0189 Encounter for other specified special examinations: Secondary | ICD-10-CM

## 2016-07-13 LAB — COMPREHENSIVE METABOLIC PANEL
ALBUMIN: 2.9 g/dL — AB (ref 3.5–5.0)
ALT: 30 U/L (ref 14–54)
AST: 23 U/L (ref 15–41)
Alkaline Phosphatase: 49 U/L (ref 38–126)
Anion gap: 10 (ref 5–15)
BUN: 8 mg/dL (ref 6–20)
CO2: 23 mmol/L (ref 22–32)
CREATININE: 0.7 mg/dL (ref 0.44–1.00)
Calcium: 8.5 mg/dL — ABNORMAL LOW (ref 8.9–10.3)
Chloride: 110 mmol/L (ref 101–111)
GFR calc Af Amer: >60 mL/min (ref >60)
GLUCOSE: 124 mg/dL — AB (ref 65–99)
Potassium: 3.9 mmol/L (ref 3.5–5.1)
Sodium: 143 mmol/L (ref 135–145)
TOTAL PROTEIN: 5.8 g/dL — AB (ref 6.5–8.1)
Total Bilirubin: 0.3 mg/dL (ref 0.3–1.2)

## 2016-07-13 LAB — CBC
HEMATOCRIT: 36.9 % (ref 36.0–46.0)
Hemoglobin: 11.8 g/dL — ABNORMAL LOW (ref 12.0–15.0)
MCH: 29.2 pg (ref 26.0–34.0)
MCHC: 32 g/dL (ref 30.0–36.0)
MCV: 91.3 fL (ref 78.0–100.0)
PLATELETS: 298 10*3/uL (ref 150–400)
RBC: 4.04 MIL/uL (ref 3.87–5.11)
RDW: 12.9 % (ref 11.5–15.5)
WBC: 7.5 10*3/uL (ref 4.0–10.5)

## 2016-07-13 LAB — GC/CHLAMYDIA PROBE AMP (~~LOC~~) NOT AT ARMC
Chlamydia: NEGATIVE
Neisseria Gonorrhea: NEGATIVE

## 2016-07-13 LAB — HIV ANTIBODY (ROUTINE TESTING W REFLEX): HIV SCREEN 4TH GENERATION: NONREACTIVE

## 2016-07-13 LAB — RPR: RPR Ser Ql: NONREACTIVE

## 2016-07-13 MED ORDER — PANTOPRAZOLE SODIUM 40 MG IV SOLR
40.0000 mg | Freq: Once | INTRAVENOUS | Status: AC
  Administered 2016-07-13: 40 mg via INTRAVENOUS
  Filled 2016-07-13: qty 40

## 2016-07-13 NOTE — Progress Notes (Signed)
PROGRESS NOTE    Stephanie Ware  ZOX:[id number] DOB: Dec 09, 1990 DOA: 07/12/2016 PCP: Lilia Argue   Brief Narrative: Stephanie Ware is a 26 y.o. female with a medical history of alcoholic gastritis presented with abdominal pain and found to have a small bowel obstruction.   Assessment & Plan:   Principal Problem:   SBO (small bowel obstruction) Active Problems:   Leukocytosis   Small bowel obstruction Bowel movement yesterday likely of contrast. Not passing gas. Nausea improved. -continue NG tube; clamp trial likely tomorrow  Leukocytosis resolved   DVT prophylaxis: Lovenox Code Status: Full code Family Communication: None at bedside Disposition Plan: Discharge in 1-2 days   Consultants:   None  Procedures:   NG tube suction  Antimicrobials:  None    Subjective: Abdominal pain improved. Nausea improved. Not passing gas. Bowel movement last night after contrast.  Objective: Vitals:   07/12/16 2051 07/13/16 0635 07/13/16 0657 07/13/16 1348  BP: 127/73 90/66  122/85  Pulse: 77 69 64 63  Resp: 18 17  18   Temp: 98.2 F (36.8 C) 98.2 F (36.8 C) 98.6 F (37 C) 99.8 F (37.7 C)  TempSrc: Oral Oral Oral Oral  SpO2: 99% 100% 98% 100%  Weight:      Height:        Intake/Output Summary (Last 24 hours) at 07/13/16 1437 Last data filed at 07/13/16 0934  Gross per 24 hour  Intake          2281.67 ml  Output                0 ml  Net          2281.67 ml   Filed Weights   07/12/16 0750  Weight: 65.8 kg (145 lb)    Examination:  General exam: Appears calm and comfortable Respiratory system: Clear to auscultation. Respiratory effort normal. Cardiovascular system: S1 & S2 heard, RRR. No murmurs, rubs, gallops or clicks. Gastrointestinal system: Abdomen is nondistended, soft and nontender. No organomegaly or masses felt. Hypoactive bowel sounds heard. Central nervous system: Alert and oriented. No focal neurological deficits. Extremities: No  edema. No calf tenderness Skin: No cyanosis. No rashes Psychiatry: Judgement and insight appear normal. Mood & affect appropriate.     Data Reviewed: I have personally reviewed following labs and imaging studies  CBC:  Recent Labs Lab 07/12/16 0754 07/13/16 0447  WBC 12.7* 7.5  HGB 14.5 11.8*  HCT 43.8 36.9  MCV 89.4 91.3  PLT 363 298   Basic Metabolic Panel:  Recent Labs Lab 07/12/16 0754 07/12/16 1728 07/13/16 0447  NA 137  --  143  K 4.0  --  3.9  CL 104  --  110  CO2 23  --  23  GLUCOSE 110*  --  124*  BUN 11  --  8  CREATININE 0.64  --  0.70  CALCIUM 9.4  --  8.5*  MG  --  1.8  --    GFR: Estimated Creatinine Clearance: 101.5 mL/min (by C-G formula based on SCr of 0.7 mg/dL). Liver Function Tests:  Recent Labs Lab 07/12/16 0754 07/13/16 0447  AST 32 23  ALT 47 30  ALKPHOS 67 49  BILITOT 0.7 0.3  PROT 7.6 5.8*  ALBUMIN 3.9 2.9*    Recent Labs Lab 07/12/16 0754  LIPASE 13   No results for input(s): AMMONIA in the last 168 hours. Coagulation Profile: No results for input(s): INR, PROTIME in the last 168 hours. Cardiac Enzymes: No results for  input(s): CKTOTAL, CKMB, CKMBINDEX, TROPONINI in the last 168 hours. BNP (last 3 results) No results for input(s): PROBNP in the last 8760 hours. HbA1C: No results for input(s): HGBA1C in the last 72 hours. CBG: No results for input(s): GLUCAP in the last 168 hours. Lipid Profile: No results for input(s): CHOL, HDL, LDLCALC, TRIG, CHOLHDL, LDLDIRECT in the last 72 hours. Thyroid Function Tests: No results for input(s): TSH, T4TOTAL, FREET4, T3FREE, THYROIDAB in the last 72 hours. Anemia Panel: No results for input(s): VITAMINB12, FOLATE, FERRITIN, TIBC, IRON, RETICCTPCT in the last 72 hours. Sepsis Labs:  Recent Labs Lab 07/12/16 1728  LATICACIDVEN 1.0    Recent Results (from the past 240 hour(s))  Wet prep, genital     Status: Abnormal   Collection Time: 07/12/16 11:17 AM  Result Value Ref  Range Status   Yeast Wet Prep HPF POC NONE SEEN NONE SEEN Final   Trich, Wet Prep NONE SEEN NONE SEEN Final   Clue Cells Wet Prep HPF POC NONE SEEN NONE SEEN Final   WBC, Wet Prep HPF POC MANY (A) NONE SEEN Final   Sperm NONE SEEN  Final         Radiology Studies: Ct Abdomen Pelvis W Contrast  Result Date: 07/12/2016 CLINICAL DATA:  Abdominal distention, cramps and belching for 1 day. EXAM: CT ABDOMEN AND PELVIS WITH CONTRAST TECHNIQUE: Multidetector CT imaging of the abdomen and pelvis was performed using the standard protocol following bolus administration of intravenous contrast. CONTRAST:  100 ml ISOVUE-300 IOPAMIDOL (ISOVUE-300) INJECTION 61% COMPARISON:  None. FINDINGS: Lower chest: Lung bases are clear. No pleural or pericardial effusion. Hepatobiliary: No focal liver abnormality is seen. No gallstones, gallbladder wall thickening, or biliary dilatation. Pancreas: Unremarkable. No pancreatic ductal dilatation or surrounding inflammatory changes. Spleen: Normal in size without focal abnormality. Adrenals/Urinary Tract: Adrenal glands are unremarkable. Kidneys are normal, without renal calculi, focal lesion, or hydronephrosis. Bladder is unremarkable. Stomach/Bowel: There is dilatation of small bowel up to 3 cm with air-fluid levels present. Fecalized small bowel contents are seen in the right lower quadrant where a transition point is identified. Loops distal to the transition are completely decompressed. There is marked angulation about the transition point suggestive of adhesions. The terminal ileum is normal in appearance. The colon is largely decompressed but otherwise appears normal. The stomach and appendix are normal. No pneumatosis, portal venous gas or free intraperitoneal air is identified. Vascular/Lymphatic: No significant vascular findings are present. No enlarged abdominal or pelvic lymph nodes. Reproductive: Uterus and bilateral adnexa are unremarkable. Other: Small volume of free  pelvic fluid is noted. The patient has a small umbilical hernia. Musculoskeletal: Appear normal. IMPRESSION: The study is positive for small bowel obstruction with a transition point identified in the right lower quadrant. The appearance of the transition point is suggestive of adhesions. No CT evidence of inflammatory bowel disease is identified. The study is otherwise negative. Electronically Signed   By: Drusilla Kanner M.D.   On: 07/12/2016 12:19   Dg Abd Portable 1v-small Bowel Obstruction Protocol-initial, 8 Hr Delay  Result Date: 07/13/2016 CLINICAL DATA:  Small bowel obstruction EXAM: PORTABLE ABDOMEN - 1 VIEW COMPARISON:  None. FINDINGS: The nasogastric tube extends into the stomach. Enteric contrast throughout the colon. No residual enteric contrast in small bowel. Mildly prominent air-filled loops of small bowel in the right lower quadrant. No extraluminal gas. IMPRESSION: Enteric contrast has reached the colon. Electronically Signed   By: Ellery Plunk M.D.   On: 07/13/2016 04:55  Dg Abd Portable 1v-small Bowel Protocol-position Verification  Result Date: 07/12/2016 CLINICAL DATA:  Evaluate NG tube placement EXAM: PORTABLE ABDOMEN - 1 VIEW COMPARISON:  CT abdomen pelvis of 07/12/2016 FINDINGS: Tip of the NG tube is noted overlying the expected proximal stomach near the fundus. The bowel gas pattern is nonspecific. Contrast is present throughout pelvocaliceal systems, ureters, and bladder from recent CT of the abdomen pelvis with IV contrast media. IMPRESSION: Tip of NG tube overlies the region of the proximal stomach. Electronically Signed   By: Dwyane Dee M.D.   On: 07/12/2016 13:51        Scheduled Meds: . enoxaparin (LOVENOX) injection  40 mg Subcutaneous Q24H  . lidocaine  15 mL Mouth/Throat Once   Continuous Infusions: . dextrose 5 % and 0.9 % NaCl with KCl 20 mEq/L 125 mL/hr at 07/13/16 0725     LOS: 1 day     Jacquelin Hawking, MD [facility] 07/13/2016, 2:37  PM Pager: [phone]  If 7PM-7AM, please contact night-coverage [email or web address] Password [email or web address] 07/13/2016, 2:37 PM

## 2016-07-14 ENCOUNTER — Inpatient Hospital Stay (HOSPITAL_COMMUNITY): Payer: No Typology Code available for payment source

## 2016-07-14 NOTE — Progress Notes (Signed)
PROGRESS NOTE    Stephanie Ware  ZDG:[id number] DOB: May 09, 1990 DOA: 07/12/2016 PCP: Lilia Argue   Brief Narrative: Stephanie Ware is a 26 y.o. female with a medical history of alcoholic gastritis presented with abdominal pain and found to have a small bowel obstruction.   Assessment & Plan:   Principal Problem:   SBO (small bowel obstruction) Active Problems:   Leukocytosis   Small bowel obstruction No gas, no nausea. Improved abdominal pain. X-ray looks good -discontinue NG tube -advance diet: clear  Leukocytosis resolved   DVT prophylaxis: Lovenox Code Status: Full code Family Communication: None at bedside Disposition Plan: Discharge likely tomorrow   Consultants:   None  Procedures:   NG tube suction  Antimicrobials:  None    Subjective: Abdominal pain improved. Nausea improved. Not passing gas. Would like NG tube out  Objective: Vitals:   07/13/16 1348 07/13/16 2200 07/14/16 0524 07/14/16 1433  BP: 122/85 129/77 136/83 116/72  Pulse: 63 63 70 60  Resp: 18 16 16 18   Temp: 99.8 F (37.7 C) 98.4 F (36.9 C) 98.1 F (36.7 C) 98.4 F (36.9 C)  TempSrc: Oral Oral Oral Oral  SpO2: 100% 98% 100% 100%  Weight:      Height:        Intake/Output Summary (Last 24 hours) at 07/14/16 1528 Last data filed at 07/14/16 1434  Gross per 24 hour  Intake              120 ml  Output              270 ml  Net             -150 ml   Filed Weights   07/12/16 0750  Weight: 65.8 kg (145 lb)    Examination:  General exam: Appears calm and comfortable Respiratory system: Clear to auscultation. Respiratory effort normal. Cardiovascular system: S1 & S2 heard, RRR. No murmurs, rubs, gallops or clicks. Gastrointestinal system: Abdomen is nondistended, soft and mildly tender in RLQ. Bowel sounds heard. Central nervous system: Alert and oriented. No focal neurological deficits. Extremities: No edema. No calf tenderness Skin: No cyanosis. No  rashes Psychiatry: Judgement and insight appear normal. Mood & affect appropriate.     Data Reviewed: I have personally reviewed following labs and imaging studies  CBC:  Recent Labs Lab 07/12/16 0754 07/13/16 0447  WBC 12.7* 7.5  HGB 14.5 11.8*  HCT 43.8 36.9  MCV 89.4 91.3  PLT 363 298   Basic Metabolic Panel:  Recent Labs Lab 07/12/16 0754 07/12/16 1728 07/13/16 0447  NA 137  --  143  K 4.0  --  3.9  CL 104  --  110  CO2 23  --  23  GLUCOSE 110*  --  124*  BUN 11  --  8  CREATININE 0.64  --  0.70  CALCIUM 9.4  --  8.5*  MG  --  1.8  --    Liver Function Tests:  Recent Labs Lab 07/12/16 0754 07/13/16 0447  AST 32 23  ALT 47 30  ALKPHOS 67 49  BILITOT 0.7 0.3  PROT 7.6 5.8*  ALBUMIN 3.9 2.9*    Recent Labs Lab 07/12/16 0754  LIPASE 13   Sepsis Labs:  Recent Labs Lab 07/12/16 1728  LATICACIDVEN 1.0    Recent Results (from the past 240 hour(s))  Wet prep, genital     Status: Abnormal   Collection Time: 07/12/16 11:17 AM  Result Value Ref Range Status  Yeast Wet Prep HPF POC NONE SEEN NONE SEEN Final   Trich, Wet Prep NONE SEEN NONE SEEN Final   Clue Cells Wet Prep HPF POC NONE SEEN NONE SEEN Final   WBC, Wet Prep HPF POC MANY (A) NONE SEEN Final   Sperm NONE SEEN  Final         Radiology Studies: Dg Abd Portable 1v  Result Date: 07/14/2016 CLINICAL DATA:  Small-bowel obstruction. EXAM: PORTABLE ABDOMEN - 1 VIEW COMPARISON:  Plain film of the abdomen dated 07/13/2016 and CT abdomen dated 07/12/2016. FINDINGS: Enteric tube is stable in position with tip in the stomach. Enteric contrast again seen within the colon. There are no dilated small bowel loops appreciated on today's exam. No evidence of free intraperitoneal air. Lung bases are clear. IMPRESSION: No radiographic evidence of small bowel obstruction on today's exam. Electronically Signed   By: Bary Richard M.D.   On: 07/14/2016 09:17   Dg Abd Portable 1v-small Bowel Obstruction  Protocol-initial, 8 Hr Delay  Result Date: 07/13/2016 CLINICAL DATA:  Small bowel obstruction EXAM: PORTABLE ABDOMEN - 1 VIEW COMPARISON:  None. FINDINGS: The nasogastric tube extends into the stomach. Enteric contrast throughout the colon. No residual enteric contrast in small bowel. Mildly prominent air-filled loops of small bowel in the right lower quadrant. No extraluminal gas. IMPRESSION: Enteric contrast has reached the colon. Electronically Signed   By: Ellery Plunk M.D.   On: 07/13/2016 04:55        Scheduled Meds: . enoxaparin (LOVENOX) injection  40 mg Subcutaneous Q24H  . lidocaine  15 mL Mouth/Throat Once   Continuous Infusions: . dextrose 5 % and 0.9 % NaCl with KCl 20 mEq/L 125 mL/hr at 07/13/16 2035     LOS: 2 days     Jacquelin Hawking, MD [facility] 07/14/2016, 3:28 PM Pager: [phone]  If 7PM-7AM, please contact night-coverage [email or web address] Password [facility] 07/14/2016, 3:28 PM

## 2016-07-15 MED ORDER — ACETAMINOPHEN 325 MG PO TABS
650.0000 mg | ORAL_TABLET | Freq: Four times a day (QID) | ORAL | Status: DC | PRN
  Administered 2016-07-15: 650 mg via ORAL
  Filled 2016-07-15: qty 2

## 2016-07-15 NOTE — Discharge Summary (Signed)
Physician Discharge Summary  Stephanie Ware ZOX:[id number] DOB: 1990/11/30 DOA: 07/12/2016  PCP: Stephanie Ware  Admit date: 07/12/2016 Discharge date: 07/15/2016  Admitted From: Home Disposition: Home  Recommendations for Outpatient Follow-up:  1. Follow up with PCP in 1 week 2. Surveillance of recurrent symptoms as CT abdomen suggested possible adhesions as cause  Home Health: None Equipment/Devices: None  Discharge Condition: Stable CODE STATUS: Full code Diet recommendation: Soft diet, advance as tolerated   Brief/Interim Summary:  Admission HPI written by Ozella Rocks, MD   Admission status: Inpatient/floor-medically necessary to stay a minimum 2 midnights to rule out impending and/or unexpected changes in physiologic status that may differ from initial evaluation performed in the ER and/or at time of admission. Presents with intractable nausea and vomiting and crampy abdominal pain with CT revealing small bowel obstruction with RLQ transition zone. Patient will require bowel rest,NPO status, IV fluids and NGT to LWS.  Chief Complaint: Nausea/vomiting with colicky abdominal pain  HPI: Stephanie Ware is a 26 y.o. female with medical history significant for episodic alcoholic gastritis in 2016. Patient reports that around midnight she awakened with crampy abdominal pain and every attempt to drink fluids resulted in large volume emesis that was nonbloody. She's not had any flatus or had a bowel movement since onset of symptoms. She has never had a bowel obstruction before.  ED Course:  Vital Signs: BP 113/69   Pulse 87   Temp 98 F (36.7 C) (Oral)   Resp 17   Ht 5\' 6"  (1.676 m)   Wt 65.8 kg (145 lb)   LMP 06/28/2016   SpO2 100%   BMI 23.40 kg/m  CT abdomen/pelvis w/ contrast: SBO w/ TZ in RLQ-suggestive of lesions-no CT evidence of IBD Lab data: Na 137, K 4.0, Cl 104, CO2 23, Gluc 110, BUN 11, Cr 0.64, WBCs 12,700, hGB 14.5, platelets 363,000, wet prep with many  WBCs o/w neg, urinalysis unremarkable except for 80 ketones and specific gravity 1.027, I-stat hcg <5, GC/Chlamydia probe pending Medications and treatments: NS bolus 1 L, morphine 4 mg IV 1, Zofran 4 mg IV 1, Benadryl 25 mg IV 1, Dilaudid 0.5 mg IV 1    Hospital course:  Small bowel obstruction CT abdomen obtained which was significant for small bowel obstruction with a transition point in the right lower quadrant, with an appearance suggesting adhesions. SBO protocol was initiated and patient had NG tube placed followed by gastrografin. She passed the gastrografin into the large intestines. NG tube was on intermittent suction with good output of bile-like fluid. Abdominal pain, nausea and vomiting resolved and patient's NG tube was removed. Diet was advanced and patient tolerated her diet well. Bowel sounds were normal before discharge and patient had a bowel movement with flatus prior to discharge.  Leukocytosis Secondary to acute small bowel obstruction. No evidence of infection. Resolved.  Discharge Diagnoses:  Principal Problem:   SBO (small bowel obstruction) Active Problems:   Leukocytosis    Discharge Instructions  Discharge Instructions    Call MD for:  difficulty breathing, headache or visual disturbances    Complete by:  As directed    Call MD for:  persistant dizziness or light-headedness    Complete by:  As directed    Call MD for:  persistant nausea and vomiting    Complete by:  As directed    Call MD for:  severe uncontrolled pain    Complete by:  As directed    Call MD for:  temperature >100.4  Complete by:  As directed    Increase activity slowly    Complete by:  As directed      Allergies as of 07/15/2016   No Known Allergies     Medication List    TAKE these medications   CLARITIN 10 MG Caps Generic drug:  Loratadine Take 10 mg by mouth daily.   Fish Oil 1000 MG Cpdr Take 1,000 mg by mouth daily.   HYDROcodone-acetaminophen 5-325 MG  tablet Commonly known as:  NORCO/VICODIN Take 1-2 tablets by mouth every 4 (four) hours as needed.   norethindrone-ethinyl estradiol-iron 1.5-30 MG-MCG tablet Commonly known as:  MICROGESTIN FE,GILDESS FE,LOESTRIN FE Take 1 tablet by mouth daily.   omeprazole 40 MG capsule Commonly known as:  PRILOSEC Take 40 mg by mouth daily.   ondansetron 8 MG disintegrating tablet Commonly known as:  ZOFRAN ODT Take 1 tablet (8 mg total) by mouth every 8 (eight) hours as needed for nausea or vomiting.   sucralfate 1 g tablet Commonly known as:  CARAFATE Take 1 tablet (1 g total) by mouth 4 (four) times daily -  with meals and at bedtime.      Follow-up Information    KAPLAN,KRISTEN, PA-C. Schedule an appointment as soon as possible for a visit in 1 week(s).   Specialty:  Family Medicine Contact information: [address] [place] [address] [phone]          No Known Allergies  Consultations:  None   Procedures/Studies: Ct Abdomen Pelvis W Contrast  Result Date: 07/12/2016 CLINICAL DATA:  Abdominal distention, cramps and belching for 1 day. EXAM: CT ABDOMEN AND PELVIS WITH CONTRAST TECHNIQUE: Multidetector CT imaging of the abdomen and pelvis was performed using the standard protocol following bolus administration of intravenous contrast. CONTRAST:  100 ml ISOVUE-300 IOPAMIDOL (ISOVUE-300) INJECTION 61% COMPARISON:  None. FINDINGS: Lower chest: Lung bases are clear. No pleural or pericardial effusion. Hepatobiliary: No focal liver abnormality is seen. No gallstones, gallbladder wall thickening, or biliary dilatation. Pancreas: Unremarkable. No pancreatic ductal dilatation or surrounding inflammatory changes. Spleen: Normal in size without focal abnormality. Adrenals/Urinary Tract: Adrenal glands are unremarkable. Kidneys are normal, without renal calculi, focal lesion, or hydronephrosis. Bladder is unremarkable. Stomach/Bowel: There is dilatation of small bowel up to 3 cm  with air-fluid levels present. Fecalized small bowel contents are seen in the right lower quadrant where a transition point is identified. Loops distal to the transition are completely decompressed. There is marked angulation about the transition point suggestive of adhesions. The terminal ileum is normal in appearance. The colon is largely decompressed but otherwise appears normal. The stomach and appendix are normal. No pneumatosis, portal venous gas or free intraperitoneal air is identified. Vascular/Lymphatic: No significant vascular findings are present. No enlarged abdominal or pelvic lymph nodes. Reproductive: Uterus and bilateral adnexa are unremarkable. Other: Small volume of free pelvic fluid is noted. The patient has a small umbilical hernia. Musculoskeletal: Appear normal. IMPRESSION: The study is positive for small bowel obstruction with a transition point identified in the right lower quadrant. The appearance of the transition point is suggestive of adhesions. No CT evidence of inflammatory bowel disease is identified. The study is otherwise negative. Electronically Signed   By: Drusilla Kanner M.D.   On: 07/12/2016 12:19   Dg Abd Portable 1v  Result Date: 07/14/2016 CLINICAL DATA:  Small-bowel obstruction. EXAM: PORTABLE ABDOMEN - 1 VIEW COMPARISON:  Plain film of the abdomen dated 07/13/2016 and CT abdomen dated 07/12/2016. FINDINGS: Enteric tube is  stable in position with tip in the stomach. Enteric contrast again seen within the colon. There are no dilated small bowel loops appreciated on today's exam. No evidence of free intraperitoneal air. Lung bases are clear. IMPRESSION: No radiographic evidence of small bowel obstruction on today's exam. Electronically Signed   By: Bary Richard M.D.   On: 07/14/2016 09:17   Dg Abd Portable 1v-small Bowel Obstruction Protocol-initial, 8 Hr Delay  Result Date: 07/13/2016 CLINICAL DATA:  Small bowel obstruction EXAM: PORTABLE ABDOMEN - 1 VIEW COMPARISON:   None. FINDINGS: The nasogastric tube extends into the stomach. Enteric contrast throughout the colon. No residual enteric contrast in small bowel. Mildly prominent air-filled loops of small bowel in the right lower quadrant. No extraluminal gas. IMPRESSION: Enteric contrast has reached the colon. Electronically Signed   By: Ellery Plunk M.D.   On: 07/13/2016 04:55   Dg Abd Portable 1v-small Bowel Protocol-position Verification  Result Date: 07/12/2016 CLINICAL DATA:  Evaluate NG tube placement EXAM: PORTABLE ABDOMEN - 1 VIEW COMPARISON:  CT abdomen pelvis of 07/12/2016 FINDINGS: Tip of the NG tube is noted overlying the expected proximal stomach near the fundus. The bowel gas pattern is nonspecific. Contrast is present throughout pelvocaliceal systems, ureters, and bladder from recent CT of the abdomen pelvis with IV contrast media. IMPRESSION: Tip of NG tube overlies the region of the proximal stomach. Electronically Signed   By: Dwyane Dee M.D.   On: 07/12/2016 13:51        Subjective: No nausea, vomiting or abdominal pain. Bowel movement last night.  Discharge Exam: Vitals:   07/14/16 2116 07/15/16 0509  BP: 136/86 116/69  Pulse: (!) 57 62  Resp: 19 19  Temp: 98.6 F (37 C) 99.2 F (37.3 C)   Vitals:   07/14/16 0524 07/14/16 1433 07/14/16 2116 07/15/16 0509  BP: 136/83 116/72 136/86 116/69  Pulse: 70 60 (!) 57 62  Resp: 16 18 19 19   Temp: 98.1 F (36.7 C) 98.4 F (36.9 C) 98.6 F (37 C) 99.2 F (37.3 C)  TempSrc: Oral Oral Oral Oral  SpO2: 100% 100% 100% 99%  Weight:      Height:        General: Pt is alert, awake, not in acute distress Cardiovascular: RRR, S1/S2 +, no rubs, no gallops Respiratory: CTA bilaterally, no wheezing, no rhonchi Abdominal: Soft, NT, ND, bowel sounds + Extremities: no edema, no cyanosis    The results of significant diagnostics from this hospitalization (including imaging, microbiology, ancillary and laboratory) are listed below for  reference.     Microbiology: Recent Results (from the past 240 hour(s))  Wet prep, genital     Status: Abnormal   Collection Time: 07/12/16 11:17 AM  Result Value Ref Range Status   Yeast Wet Prep HPF POC NONE SEEN NONE SEEN Final   Trich, Wet Prep NONE SEEN NONE SEEN Final   Clue Cells Wet Prep HPF POC NONE SEEN NONE SEEN Final   WBC, Wet Prep HPF POC MANY (A) NONE SEEN Final   Sperm NONE SEEN  Final     Labs: BNP (last 3 results) No results for input(s): BNP in the last 8760 hours. Basic Metabolic Panel:  Recent Labs Lab 07/12/16 0754 07/12/16 1728 07/13/16 0447  NA 137  --  143  K 4.0  --  3.9  CL 104  --  110  CO2 23  --  23  GLUCOSE 110*  --  124*  BUN 11  --  8  CREATININE 0.64  --  0.70  CALCIUM 9.4  --  8.5*  MG  --  1.8  --    Liver Function Tests:  Recent Labs Lab 07/12/16 0754 07/13/16 0447  AST 32 23  ALT 47 30  ALKPHOS 67 49  BILITOT 0.7 0.3  PROT 7.6 5.8*  ALBUMIN 3.9 2.9*    Recent Labs Lab 07/12/16 0754  LIPASE 13   No results for input(s): AMMONIA in the last 168 hours. CBC:  Recent Labs Lab 07/12/16 0754 07/13/16 0447  WBC 12.7* 7.5  HGB 14.5 11.8*  HCT 43.8 36.9  MCV 89.4 91.3  PLT 363 298   Cardiac Enzymes: No results for input(s): CKTOTAL, CKMB, CKMBINDEX, TROPONINI in the last 168 hours. BNP: Invalid input(s): POCBNP CBG: No results for input(s): GLUCAP in the last 168 hours. D-Dimer No results for input(s): DDIMER in the last 72 hours. Hgb A1c No results for input(s): HGBA1C in the last 72 hours. Lipid Profile No results for input(s): CHOL, HDL, LDLCALC, TRIG, CHOLHDL, LDLDIRECT in the last 72 hours. Thyroid function studies No results for input(s): TSH, T4TOTAL, T3FREE, THYROIDAB in the last 72 hours.  Invalid input(s): FREET3 Anemia work up No results for input(s): VITAMINB12, FOLATE, FERRITIN, TIBC, IRON, RETICCTPCT in the last 72 hours. Urinalysis    Component Value Date/Time   COLORURINE YELLOW  07/12/2016 1019   APPEARANCEUR CLEAR 07/12/2016 1019   LABSPEC 1.027 07/12/2016 1019   PHURINE 5.0 07/12/2016 1019   GLUCOSEU NEGATIVE 07/12/2016 1019   HGBUR NEGATIVE 07/12/2016 1019   BILIRUBINUR NEGATIVE 07/12/2016 1019   KETONESUR 80 (A) 07/12/2016 1019   PROTEINUR NEGATIVE 07/12/2016 1019   UROBILINOGEN 0.2 12/10/2014 2041   NITRITE NEGATIVE 07/12/2016 1019   LEUKOCYTESUR NEGATIVE 07/12/2016 1019   Sepsis Labs Invalid input(s): PROCALCITONIN,  WBC,  LACTICIDVEN Microbiology Recent Results (from the past 240 hour(s))  Wet prep, genital     Status: Abnormal   Collection Time: 07/12/16 11:17 AM  Result Value Ref Range Status   Yeast Wet Prep HPF POC NONE SEEN NONE SEEN Final   Trich, Wet Prep NONE SEEN NONE SEEN Final   Clue Cells Wet Prep HPF POC NONE SEEN NONE SEEN Final   WBC, Wet Prep HPF POC MANY (A) NONE SEEN Final   Sperm NONE SEEN  Final     Time coordinating discharge: Over 30 minutes  SIGNED:   Jacquelin Hawking, MD [facility] 07/15/2016, 2:53 PM Pager [phone]  If 7PM-7AM, please contact night-coverage [email or web address] Password [email or web address]

## 2016-07-15 NOTE — Progress Notes (Signed)
Nsg Discharge Note  Admit Date:  07/12/2016 Discharge date: 07/15/2016   Febe Layden to be D/C'd Home per MD order.  AVS completed.  Copy for chart, and copy for patient signed, and dated. Patient/caregiver able to verbalize understanding.  Discharge Medication: Allergies as of 07/15/2016   No Known Allergies     Medication List    TAKE these medications   CLARITIN 10 MG Caps Generic drug:  Loratadine Take 10 mg by mouth daily.   Fish Oil 1000 MG Cpdr Take 1,000 mg by mouth daily.   HYDROcodone-acetaminophen 5-325 MG tablet Commonly known as:  NORCO/VICODIN Take 1-2 tablets by mouth every 4 (four) hours as needed.   norethindrone-ethinyl estradiol-iron 1.5-30 MG-MCG tablet Commonly known as:  MICROGESTIN FE,GILDESS FE,LOESTRIN FE Take 1 tablet by mouth daily.   omeprazole 40 MG capsule Commonly known as:  PRILOSEC Take 40 mg by mouth daily.   ondansetron 8 MG disintegrating tablet Commonly known as:  ZOFRAN ODT Take 1 tablet (8 mg total) by mouth every 8 (eight) hours as needed for nausea or vomiting.   sucralfate 1 g tablet Commonly known as:  CARAFATE Take 1 tablet (1 g total) by mouth 4 (four) times daily -  with meals and at bedtime.       Discharge Assessment: Vitals:   07/14/16 2116 07/15/16 0509  BP: 136/86 116/69  Pulse: (!) 57 62  Resp: 19 19  Temp: 98.6 F (37 C) 99.2 F (37.3 C)   Skin clean, dry and intact without evidence of skin break down, no evidence of skin tears noted. IV catheter discontinued intact. Site without signs and symptoms of complications - no redness or edema noted at insertion site, patient denies c/o pain - only slight tenderness at site.  Dressing with slight pressure applied.  D/c Instructions-Education: Discharge instructions given to patient/family with verbalized understanding. D/c education completed with patient/family including follow up instructions, medication list, d/c activities limitations if indicated, with other  d/c instructions as indicated by MD - patient able to verbalize understanding, all questions fully answered. Patient instructed to return to ED, call 911, or call MD for any changes in condition.  Patient escorted via WC, and D/C home via private auto.  Perley Arthurs Consuella Lose, RN 07/15/2016 3:54 PM

## 2016-08-13 ENCOUNTER — Other Ambulatory Visit: Payer: Self-pay | Admitting: Gastroenterology

## 2016-08-13 DIAGNOSIS — K56609 Unspecified intestinal obstruction, unspecified as to partial versus complete obstruction: Secondary | ICD-10-CM

## 2016-08-14 ENCOUNTER — Encounter (HOSPITAL_COMMUNITY)
Admission: RE | Disposition: A | Discharge status: Home / Self Care | Payer: Self-pay | Source: Ambulatory Visit | Attending: Gastroenterology

## 2016-08-14 ENCOUNTER — Encounter (HOSPITAL_COMMUNITY): Payer: Self-pay | Admitting: *Deleted

## 2016-08-14 ENCOUNTER — Ambulatory Visit (HOSPITAL_COMMUNITY)
Admission: RE | Admit: 2016-08-14 | Discharge: 2016-08-14 | Disposition: A | Discharge status: Home / Self Care | Payer: No Typology Code available for payment source | Source: Ambulatory Visit | Attending: Gastroenterology | Admitting: Gastroenterology

## 2016-08-14 SURGERY — CANCELLED PROCEDURE

## 2016-08-14 SURGERY — Surgical Case
Anesthesia: *Unknown

## 2016-08-14 MED ORDER — SODIUM CHLORIDE 0.9 % IV SOLN
INTRAVENOUS | Status: DC
Start: 2016-08-14 — End: 2016-08-14

## 2016-08-14 NOTE — Progress Notes (Signed)
Agile capsule cancelled for today due to patient not being NPO this am.  Procedure rescheduled for tomorrow am.  Pt given instructions for diet.  Roselie Awkward, RN

## 2016-08-15 ENCOUNTER — Ambulatory Visit (HOSPITAL_COMMUNITY)
Admission: RE | Admit: 2016-08-15 | Discharge: 2016-08-15 | Disposition: A | Discharge status: Home / Self Care | Payer: No Typology Code available for payment source | Source: Ambulatory Visit | Attending: Gastroenterology | Admitting: Gastroenterology

## 2016-08-15 ENCOUNTER — Encounter (HOSPITAL_COMMUNITY)
Admission: RE | Disposition: A | Discharge status: Home / Self Care | Payer: Self-pay | Source: Ambulatory Visit | Attending: Gastroenterology

## 2016-08-15 DIAGNOSIS — K56699 Other intestinal obstruction unspecified as to partial versus complete obstruction: Secondary | ICD-10-CM | POA: Insufficient documentation

## 2016-08-15 HISTORY — PX: AGILE CAPSULE: SHX5420

## 2016-08-15 SURGERY — AGILE CAPSULE

## 2016-08-15 NOTE — Progress Notes (Signed)
Pt swallowed agile capsule without difficulty with a sip of water.  Reviewed instructions on when patient has to return for x-ray and when patient can resume intake and when to take mirilax.  Patient verbalized understanding.  Patient denied any other questions.  Pt to return for x-ray around 8:30 tomorrow morning.

## 2016-08-16 ENCOUNTER — Ambulatory Visit
Admission: RE | Admit: 2016-08-16 | Discharge: 2016-08-16 | Disposition: A | Discharge status: Home / Self Care | Payer: No Typology Code available for payment source | Source: Ambulatory Visit | Attending: Gastroenterology | Admitting: Gastroenterology

## 2016-08-16 ENCOUNTER — Encounter (HOSPITAL_COMMUNITY): Payer: Self-pay | Admitting: Gastroenterology

## 2016-08-16 DIAGNOSIS — K56609 Unspecified intestinal obstruction, unspecified as to partial versus complete obstruction: Secondary | ICD-10-CM

## 2016-08-21 ENCOUNTER — Ambulatory Visit (HOSPITAL_COMMUNITY)
Admission: RE | Admit: 2016-08-21 | Discharge: 2016-08-21 | Disposition: A | Discharge status: Home / Self Care | Payer: No Typology Code available for payment source | Source: Ambulatory Visit | Attending: Gastroenterology | Admitting: Gastroenterology

## 2016-08-21 ENCOUNTER — Encounter (HOSPITAL_COMMUNITY)
Admission: RE | Disposition: A | Discharge status: Home / Self Care | Payer: Self-pay | Source: Ambulatory Visit | Attending: Gastroenterology

## 2016-08-21 DIAGNOSIS — K56609 Unspecified intestinal obstruction, unspecified as to partial versus complete obstruction: Secondary | ICD-10-CM | POA: Insufficient documentation

## 2016-08-21 HISTORY — PX: GIVENS CAPSULE STUDY: SHX5432

## 2016-08-21 SURGERY — IMAGING PROCEDURE, GI TRACT, INTRALUMINAL, VIA CAPSULE

## 2016-08-21 SURGICAL SUPPLY — 1 items: TOWEL COTTON PACK 4EA (MISCELLANEOUS) ×6 IMPLANT

## 2016-08-21 NOTE — Progress Notes (Signed)
Patient came from home today for her 8 hr capsule endoscopy. She was given instructions about when to drink, eat, and return the capsule. She verbalized understanding and asked if she could return tomorrow afternoon after she gets off work, approx 1400. I gave her our Endo phone number to call when she was on her way to ensure our staff would be available.

## 2016-08-22 ENCOUNTER — Encounter (HOSPITAL_COMMUNITY): Payer: Self-pay | Admitting: Gastroenterology

## 2016-09-12 ENCOUNTER — Encounter (HOSPITAL_COMMUNITY): Payer: Self-pay | Admitting: General Practice

## 2016-09-12 ENCOUNTER — Other Ambulatory Visit: Payer: Self-pay | Admitting: Gastroenterology

## 2016-09-12 ENCOUNTER — Inpatient Hospital Stay (HOSPITAL_COMMUNITY)
Admission: AD | Admit: 2016-09-12 | Discharge: 2016-09-15 | DRG: 389 | Disposition: A | Discharge status: Home / Self Care | Payer: No Typology Code available for payment source | Source: Ambulatory Visit | Attending: Nephrology | Admitting: Nephrology

## 2016-09-12 ENCOUNTER — Ambulatory Visit
Admission: RE | Admit: 2016-09-12 | Discharge: 2016-09-12 | Disposition: A | Discharge status: Home / Self Care | Payer: No Typology Code available for payment source | Source: Ambulatory Visit | Attending: Gastroenterology | Admitting: Gastroenterology

## 2016-09-12 DIAGNOSIS — R1033 Periumbilical pain: Secondary | ICD-10-CM

## 2016-09-12 DIAGNOSIS — R112 Nausea with vomiting, unspecified: Secondary | ICD-10-CM | POA: Diagnosis present

## 2016-09-12 DIAGNOSIS — F17210 Nicotine dependence, cigarettes, uncomplicated: Secondary | ICD-10-CM | POA: Diagnosis present

## 2016-09-12 DIAGNOSIS — K5090 Crohn's disease, unspecified, without complications: Secondary | ICD-10-CM | POA: Diagnosis present

## 2016-09-12 DIAGNOSIS — Z79891 Long term (current) use of opiate analgesic: Secondary | ICD-10-CM

## 2016-09-12 DIAGNOSIS — K56609 Unspecified intestinal obstruction, unspecified as to partial versus complete obstruction: Principal | ICD-10-CM | POA: Diagnosis present

## 2016-09-12 DIAGNOSIS — D72829 Elevated white blood cell count, unspecified: Secondary | ICD-10-CM | POA: Diagnosis present

## 2016-09-12 DIAGNOSIS — Z885 Allergy status to narcotic agent status: Secondary | ICD-10-CM

## 2016-09-12 DIAGNOSIS — Z79899 Other long term (current) drug therapy: Secondary | ICD-10-CM

## 2016-09-12 HISTORY — DX: Headache, unspecified: R51.9

## 2016-09-12 HISTORY — DX: Headache: R51

## 2016-09-12 LAB — CBC WITH DIFFERENTIAL/PLATELET
Basophils Absolute: 0 10*3/uL (ref 0.0–0.1)
Basophils Relative: 0 %
EOS PCT: 0 %
Eosinophils Absolute: 0 10*3/uL (ref 0.0–0.7)
HCT: 44.9 % (ref 36.0–46.0)
Hemoglobin: 15.1 g/dL — ABNORMAL HIGH (ref 12.0–15.0)
LYMPHS ABS: 1.4 10*3/uL (ref 0.7–4.0)
LYMPHS PCT: 8 %
MCH: 29.8 pg (ref 26.0–34.0)
MCHC: 33.6 g/dL (ref 30.0–36.0)
MCV: 88.7 fL (ref 78.0–100.0)
MONO ABS: 0.7 10*3/uL (ref 0.1–1.0)
Monocytes Relative: 4 %
Neutro Abs: 14.9 10*3/uL — ABNORMAL HIGH (ref 1.7–7.7)
Neutrophils Relative %: 88 %
PLATELETS: 371 10*3/uL (ref 150–400)
RBC: 5.06 MIL/uL (ref 3.87–5.11)
RDW: 12.7 % (ref 11.5–15.5)
WBC: 17 10*3/uL — AB (ref 4.0–10.5)

## 2016-09-12 LAB — COMPREHENSIVE METABOLIC PANEL
ALBUMIN: 4.2 g/dL (ref 3.5–5.0)
ALK PHOS: 62 U/L (ref 38–126)
ALT: 28 U/L (ref 14–54)
AST: 26 U/L (ref 15–41)
Anion gap: 13 (ref 5–15)
BUN: 7 mg/dL (ref 6–20)
CALCIUM: 9.4 mg/dL (ref 8.9–10.3)
CO2: 20 mmol/L — AB (ref 22–32)
CREATININE: 0.65 mg/dL (ref 0.44–1.00)
Chloride: 101 mmol/L (ref 101–111)
GFR calc Af Amer: >60 mL/min (ref >60)
GFR calc non Af Amer: >60 mL/min (ref >60)
GLUCOSE: 111 mg/dL — AB (ref 65–99)
Potassium: 3.8 mmol/L (ref 3.5–5.1)
SODIUM: 134 mmol/L — AB (ref 135–145)
Total Bilirubin: 0.9 mg/dL (ref 0.3–1.2)
Total Protein: 8 g/dL (ref 6.5–8.1)

## 2016-09-12 LAB — MAGNESIUM: Magnesium: 1.7 mg/dL (ref 1.7–2.4)

## 2016-09-12 LAB — TSH: TSH: 3.137 u[IU]/mL (ref 0.350–4.500)

## 2016-09-12 LAB — PHOSPHORUS: Phosphorus: 3.6 mg/dL (ref 2.5–4.6)

## 2016-09-12 MED ORDER — FENTANYL CITRATE (PF) 100 MCG/2ML IJ SOLN
25.0000 ug | INTRAMUSCULAR | Status: DC | PRN
  Administered 2016-09-12 (×2): 25 ug via INTRAVENOUS
  Filled 2016-09-12 (×3): qty 2

## 2016-09-12 MED ORDER — METHYLPREDNISOLONE SODIUM SUCC 125 MG IJ SOLR
60.0000 mg | Freq: Every day | INTRAMUSCULAR | Status: DC
  Administered 2016-09-12 – 2016-09-13 (×2): 60 mg via INTRAVENOUS
  Filled 2016-09-12 (×2): qty 2

## 2016-09-12 MED ORDER — ONDANSETRON HCL 4 MG/2ML IJ SOLN
4.0000 mg | Freq: Four times a day (QID) | INTRAMUSCULAR | Status: DC | PRN
  Administered 2016-09-12: 4 mg via INTRAVENOUS
  Filled 2016-09-12: qty 2

## 2016-09-12 MED ORDER — HYDROMORPHONE HCL 1 MG/ML IJ SOLN
0.5000 mg | Freq: Once | INTRAMUSCULAR | Status: AC
  Administered 2016-09-12: 0.5 mg via INTRAVENOUS
  Filled 2016-09-12: qty 1

## 2016-09-12 MED ORDER — HEPARIN SODIUM (PORCINE) 5000 UNIT/ML IJ SOLN
5000.0000 [IU] | Freq: Three times a day (TID) | INTRAMUSCULAR | Status: DC
Start: 2016-09-12 — End: 2016-09-14
  Filled 2016-09-12: qty 1

## 2016-09-12 MED ORDER — PROMETHAZINE HCL 25 MG/ML IJ SOLN
12.5000 mg | Freq: Once | INTRAMUSCULAR | Status: AC
  Administered 2016-09-12: 12.5 mg via INTRAVENOUS
  Filled 2016-09-12: qty 1

## 2016-09-12 MED ORDER — DEXTROSE-NACL 5-0.9 % IV SOLN
INTRAVENOUS | Status: DC
  Administered 2016-09-12 – 2016-09-14 (×5): via INTRAVENOUS

## 2016-09-12 MED ORDER — ONDANSETRON HCL 4 MG PO TABS: 4.0000 mg | ORAL_TABLET | Freq: Four times a day (QID) | ORAL | Status: DC | PRN

## 2016-09-12 MED ORDER — IOPAMIDOL (ISOVUE-300) INJECTION 61%
100.0000 mL | Freq: Once | INTRAVENOUS | Status: AC | PRN
  Administered 2016-09-12: 100 mL via INTRAVENOUS

## 2016-09-12 NOTE — Consult Note (Signed)
Reason for Consult: small obstruction obstruction Referring Physician: Bradyn Vassey is an 26 y.o. female.  HPI: 26 yo presented to ER after 1 day of intermittent abdominal pain. Pain is worse periumbilically, no radiation. She has had 8 episodes of vomiting the last ones being bilious. Her last bowel movement was yesterday. She denies fevers. This has happened 2 times in the past where she was treated with NG tube decompression and then showed improvement after a few days. She recently underwent capsule endoscopy which showed no specific findings.  Past Medical History:  Diagnosis Date  . Small bowel obstruction ([place]) 07/12/2016    Past Surgical History:  Procedure Laterality Date  . AGILE CAPSULE N/A 08/15/2016   Procedure: AGILE CAPSULE;  Surgeon: Juanita Craver, MD;  Location: [facility] ENDOSCOPY;  Service: Endoscopy;  Laterality: N/A;  . GIVENS CAPSULE STUDY N/A 08/21/2016   Procedure: GIVENS CAPSULE STUDY;  Surgeon: Juanita Craver, MD;  Location: [facility] ENDOSCOPY;  Service: Endoscopy;  Laterality: N/A;  . WISDOM TOOTH EXTRACTION  ~ 2011    No family history on file.  Social History:  reports that she has been smoking Cigarettes.  She has a 0.72 pack-year smoking history. She has never used smokeless tobacco. She reports that she drinks about 13.2 oz of alcohol per week . She reports that she does not use drugs.  Allergies:  Allergies  Allergen Reactions  . Morphine And Related Hives    Medications: I have reviewed the patient's current medications.  Results for orders placed or performed during the hospital encounter of 09/12/16 (from the past 48 hour(s))  Comprehensive metabolic panel     Status: Abnormal   Collection Time: 09/12/16  6:09 PM  Result Value Ref Range   Sodium 134 (L) 135 - 145 mmol/L   Potassium 3.8 3.5 - 5.1 mmol/L   Chloride 101 101 - 111 mmol/L   CO2 20 (L) 22 - 32 mmol/L   Glucose, Bld 111 (H) 65 - 99 mg/dL   BUN 7 6 - 20 mg/dL   Creatinine, Ser 0.65  0.44 - 1.00 mg/dL   Calcium 9.4 8.9 - 10.3 mg/dL   Total Protein 8.0 6.5 - 8.1 g/dL   Albumin 4.2 3.5 - 5.0 g/dL   AST 26 15 - 41 U/L   ALT 28 14 - 54 U/L   Alkaline Phosphatase 62 38 - 126 U/L   Total Bilirubin 0.9 0.3 - 1.2 mg/dL   GFR calc non Af Amer >60 >60 mL/min   GFR calc Af Amer >60 >60 mL/min    Comment: (NOTE) The eGFR has been calculated using the CKD EPI equation. This calculation has not been validated in all clinical situations. eGFR's persistently <60 mL/min signify possible Chronic Kidney Disease.    Anion gap 13 5 - 15  Magnesium     Status: None   Collection Time: 09/12/16  6:09 PM  Result Value Ref Range   Magnesium 1.7 1.7 - 2.4 mg/dL  Phosphorus     Status: None   Collection Time: 09/12/16  6:09 PM  Result Value Ref Range   Phosphorus 3.6 2.5 - 4.6 mg/dL  CBC WITH DIFFERENTIAL     Status: Abnormal   Collection Time: 09/12/16  6:09 PM  Result Value Ref Range   WBC 17.0 (H) 4.0 - 10.5 K/uL   RBC 5.06 3.87 - 5.11 MIL/uL   Hemoglobin 15.1 (H) 12.0 - 15.0 g/dL   HCT 44.9 36.0 - 46.0 %   MCV 88.7  78.0 - 100.0 fL   MCH 29.8 26.0 - 34.0 pg   MCHC 33.6 30.0 - 36.0 g/dL   RDW 12.7 11.5 - 15.5 %   Platelets 371 150 - 400 K/uL   Neutrophils Relative % 88 %   Neutro Abs 14.9 (H) 1.7 - 7.7 K/uL   Lymphocytes Relative 8 %   Lymphs Abs 1.4 0.7 - 4.0 K/uL   Monocytes Relative 4 %   Monocytes Absolute 0.7 0.1 - 1.0 K/uL   Eosinophils Relative 0 %   Eosinophils Absolute 0.0 0.0 - 0.7 K/uL   Basophils Relative 0 %   Basophils Absolute 0.0 0.0 - 0.1 K/uL  TSH     Status: None   Collection Time: 09/12/16  6:09 PM  Result Value Ref Range   TSH 3.137 0.350 - 4.500 uIU/mL    Comment: Performed by a 3rd Generation assay with a functional sensitivity of <=0.01 uIU/mL.    Ct Entero Abd/pelvis W Contast  Addendum Date: 09/12/2016   ADDENDUM REPORT: 09/12/2016 15:24 ADDENDUM: Immediately proximal to the area of abrupt small bowel narrowing in the distal ileum is a 3.8  cm diverticular like outpouching from the small bowel (seen on image 127 of series 3 and well demonstrated on coronal image 35 of series 601). 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 No other diverticular disease is noted in the small bowel or colon. I discussed these findings with the patient who reports that she has had recurrent bouts of these symptoms since 2016. 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 In the setting of recurrent stricturing/chronic obstruction, perhaps the focal diverticular type structure just proximal to this stricture is secondary. Electronically Signed   By: Misty Stanley M.D.   On: 09/12/2016 15:24   Result Date: 09/12/2016 CLINICAL DATA:  History of small-bowel obstruction. Periumbilical pain. EXAM: CT ABDOMEN AND PELVIS WITH CONTRAST (ENTEROGRAPHY) TECHNIQUE: Multidetector CT of the abdomen and pelvis during bolus administration of intravenous contrast. Negative oral contrast was given. CONTRAST:  158m ISOVUE-300 IOPAMIDOL (ISOVUE-300) INJECTION 61% COMPARISON:  07/12/2016 FINDINGS: The patient was unable to tolerate oral contrast on today's study, vomiting after consuming the second of 2 bottles. Lower chest:  Unremarkable. Hepatobiliary: No focal abnormality within the liver parenchyma. There is no evidence for gallstones, gallbladder wall thickening, or pericholecystic fluid. No intrahepatic or extrahepatic biliary dilation. Pancreas: No focal mass lesion. No dilatation of the main duct. No intraparenchymal cyst. No peripancreatic edema. Spleen: No splenomegaly. No focal mass lesion.  Adrenals/Urinary Tract: No adrenal nodule or mass.Kidneys are unremarkable. No evidence for hydroureter. The urinary bladder appears normal for the degree of distention. Stomach/Bowel: Stomach is nondistended. No gastric wall thickening. No evidence of outlet obstruction. No abnormal enhancement in the stomach. Duodenum is normal without wall thickening or abnormal enhancement. Jejunal loops are not well distended but show no abnormal enhancement. Mid and distal small bowel, mainly representing ileum is fluid-filled and distended measuring up to 3.6 cm diameter distally. At about 50 cm proximal to the ileocecal valve there is an abrupt transition from normal bowel wall to a long segment of luminal narrowing and inner wall hyperenhancement. The abnormal inner  wall hyper enhancement luminal narrowing extends into the terminal ileum and the degree of inner wall/mucosal hyper enhancement is most prominent at the level of the terminal ileum. The ileum proximal to the long abnormal segment is dilated and demonstrates a small bowel feces sign which is compatible with stasis. Together, these imaging features are highly suggestive of an inflammatory stricture. The appendix is normal.  The colon is normal. Vascular/Lymphatic: No abdominal aortic aneurysm. No abdominal aortic atherosclerotic calcification. There is no gastrohepatic or hepatoduodenal ligament lymphadenopathy. Mild lymphadenopathy noted in the ileocolic mesenteric, similar to prior. No pelvic sidewall lymphadenopathy. Reproductive: The uterus has normal CT imaging appearance. There is no adnexal mass. Other: Small volume intraperitoneal free fluid noted in the cul-de-sac. Musculoskeletal: Bone windows reveal no worrisome lytic or sclerotic osseous lesions. IMPRESSION: 1. Approximately 40- 50 cm segment of abnormal distal ileum extends into the terminal ileum. This segment shows inner wall hyper enhancement, most prominent in the terminal ileum, and marked  luminal narrowing. Small bowel just proximal to this segment is dilated and shows a small bowel feces sign consistent with intraluminal stasis. The abnormal distal ileum shows an ill-defined wall with mild thickening and subtle interloop mesenteric fluid. Together, imaging features are highly suspicious for inflammatory stricture and compatible with inflammatory bowel disease. No evidence for perforation, fistula, or abscess at this time. 2. Mild lymphadenopathy in the ileocolic mesentery. I personally called Dr. Collene Mares immediately after study interpretation to discuss these results. Electronically Signed: By: Misty Stanley M.D. On: 09/12/2016 15:02    Review of Systems  Constitutional: Negative for chills and fever.  HENT: Negative for hearing loss.   Eyes: Negative for blurred vision and double vision.  Respiratory: Negative for cough and hemoptysis.   Cardiovascular: Negative for chest pain and palpitations.  Gastrointestinal: Positive for abdominal pain, nausea and vomiting.  Genitourinary: Negative for dysuria and urgency.  Musculoskeletal: Negative for myalgias and neck pain.  Skin: Negative for itching and rash.  Neurological: Negative for dizziness, tingling and headaches.  Endo/Heme/Allergies: Does not bruise/bleed easily.  Psychiatric/Behavioral: Negative for depression and suicidal ideas.   Blood pressure (!) 147/76, pulse 62, temperature 98.1 F (36.7 C), temperature source Oral, resp. rate 18, height _0  (1.676 m), weight 70.3 kg (154 lb 14.4 oz), last menstrual period 08/22/2016, SpO2 100 %. Physical Exam  Vitals reviewed. Constitutional: She is oriented to person, place, and time. She appears well-developed and well-nourished.  HENT:  Head: Normocephalic and atraumatic.  Eyes: Conjunctivae and EOM are normal. Pupils are equal, round, and reactive to light.  Neck: Normal range of motion. Neck supple.  Cardiovascular: Normal rate and regular rhythm.   Respiratory: Effort  normal and breath sounds normal.  GI: Soft. Bowel sounds are normal. She exhibits no distension. There is tenderness.  Musculoskeletal: Normal range of motion.  Neurological: She is alert and oriented to person, place, and time.  Skin: Skin is warm and dry.  Psychiatric: She has a normal mood and affect. Her behavior is normal.    Assessment/Plan: 26 yo female with recurrent abdominal pain with vomiting and findings consistent with bowel obstruction. Her CT findings are concerning for inflammatory process vs stricture (though noted stricture is not the transition point). Currently, I agree with steroid treatment, bowel rest, fluids, if vomits more may warrant NG tube. -we will continue to follow along  Arta Bruce Kinsinger 09/12/2016, 7:43 PM

## 2016-09-12 NOTE — H&P (Signed)
History and Physical    Stephanie Ware UJW:[id number] DOB: 06-Aug-1990 DOA: 09/12/2016  PCP: Richmond Campbell., PA-C  Patient coming from: *home   Chief Complaint: Nausea and emesis  HPI: Stephanie Ware is a 26 y.o. female with medical history significant of nausea and vomiting which has started since 3 AM this morning. Patient has had prior episodes which have landed her in the hospital. The last one being in April. 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 As such she was recommended for admission for fluids steroids and general surgery consult. The patient states that since onset of nausea and emesis the symptoms have not gotten better, had been persistent, and it is getting worse.   Review of Systems: As per HPI otherwise 10 point review of systems negative.    Past Medical History:  Diagnosis Date  . Small bowel obstruction ([facility]) 07/12/2016    Past Surgical History:  Procedure Laterality Date  . AGILE CAPSULE N/A 08/15/2016   Procedure: AGILE CAPSULE;  Surgeon: Charna Elizabeth, MD;  Location: [facility] ENDOSCOPY;  Service: Endoscopy;  Laterality: N/A;  . GIVENS CAPSULE STUDY N/A 08/21/2016   Procedure: GIVENS CAPSULE STUDY;  Surgeon: Charna Elizabeth, MD;  Location: [facility] ENDOSCOPY;  Service: Endoscopy;  Laterality: N/A;  . WISDOM TOOTH EXTRACTION  ~ 2011     reports that she has been smoking Cigarettes.  She has a 0.72 pack-year smoking history. She has never used smokeless tobacco. She reports that she drinks about 13.2 oz of alcohol per week . She reports that she does not use drugs.  Allergies  Allergen Reactions  . Morphine And Related Hives    Family history - Meckel diverticulum   Prior to Admission medications   Medication Sig Start Date End Date Taking? Authorizing Provider  HYDROcodone-acetaminophen (NORCO/VICODIN) 5-325 MG per tablet Take 1-2 tablets by mouth every 4 (four) hours as  needed. Patient taking differently: Take 1-2 tablets by mouth every 4 (four) hours as needed for moderate pain.  12/11/14  Yes Plunkett, Alphonzo Lemmings, MD  Loratadine (CLARITIN) 10 MG CAPS Take 10 mg by mouth daily.   Yes [provider]  Norethindrone Acetate-Ethinyl Estrad-FE (BLISOVI 24 FE) 1-20 MG-MCG(24) tablet Take 1 tablet by mouth daily.   Yes [provider]  Omega-3 Fatty Acids (FISH OIL) 1000 MG CPDR Take 1,000 mg by mouth daily.   Yes [provider]  omeprazole (PRILOSEC) 40 MG capsule Take 40 mg by mouth daily.    Yes [provider]  ondansetron (ZOFRAN ODT) 8 MG disintegrating tablet Take 1 tablet (8 mg total) by mouth every 8 (eight) hours as needed for nausea or vomiting. Patient not taking: Reported on 07/12/2016 12/11/14   Gwyneth Sprout, MD  sucralfate (CARAFATE) 1 G tablet Take 1 tablet (1 g total) by mouth 4 (four) times daily -  with meals and at bedtime. Patient not taking: Reported on 07/12/2016 12/11/14   Gwyneth Sprout, MD    Physical Exam: Vitals:   09/12/16 1632  BP: (!) 147/76  Pulse: 62  Resp: 18  Temp: 98.1 F (36.7 C)  TempSrc: Oral  SpO2: 100%    Constitutional: NAD, calm, comfortable Vitals:   09/12/16 1632  BP: (!) 147/76  Pulse: 62  Resp: 18  Temp: 98.1 F (36.7 C)  TempSrc: Oral  SpO2: 100%   Eyes: PERRL, lids and conjunctivae normal ENMT: Mucous membranes are moist. Posterior pharynx clear of any exudate or lesions. Normal  dentition.  Neck: normal, supple, no masses, no thyromegaly Respiratory: clear to auscultation bilaterally, no wheezing, no crackles. Normal respiratory effort. No accessory muscle use.  Cardiovascular: Regular rate and rhythm, no murmurs / rubs / gallops. No extremity edema. 2+ pedal pulses. No carotid bruits.  Abdomen: no tenderness on light palpation, no masses palpated. No hepatosplenomegaly. No guarding, no rebound tenderness Musculoskeletal: no clubbing / cyanosis. No joint deformity  upper and lower extremities.  Skin: no rashes, lesions, ulcers. No induration, on limited exam Neurologic: CN 2-12 grossly intact. Sensation intact Psychiatric: Normal judgment and insight. Alert and oriented x 3. Normal mood.    Labs on Admission: I have personally reviewed following labs and imaging studies  CBC: No results for input(s): WBC, NEUTROABS, HGB, HCT, MCV, PLT in the last 168 hours. Basic Metabolic Panel: No results for input(s): NA, K, CL, CO2, GLUCOSE, BUN, CREATININE, CALCIUM, MG, PHOS in the last 168 hours. GFR: CrCl cannot be calculated (Patient's most recent lab result is older than the maximum 21 days allowed.). Liver Function Tests: No results for input(s): AST, ALT, ALKPHOS, BILITOT, PROT, ALBUMIN in the last 168 hours. No results for input(s): LIPASE, AMYLASE in the last 168 hours. No results for input(s): AMMONIA in the last 168 hours. Coagulation Profile: No results for input(s): INR, PROTIME in the last 168 hours. Cardiac Enzymes: No results for input(s): CKTOTAL, CKMB, CKMBINDEX, TROPONINI in the last 168 hours. BNP (last 3 results) No results for input(s): PROBNP in the last 8760 hours. HbA1C: No results for input(s): HGBA1C in the last 72 hours. CBG: No results for input(s): GLUCAP in the last 168 hours. Lipid Profile: No results for input(s): CHOL, HDL, LDLCALC, TRIG, CHOLHDL, LDLDIRECT in the last 72 hours. Thyroid Function Tests: No results for input(s): TSH, T4TOTAL, FREET4, T3FREE, THYROIDAB in the last 72 hours. Anemia Panel: No results for input(s): VITAMINB12, FOLATE, FERRITIN, TIBC, IRON, RETICCTPCT in the last 72 hours. Urine analysis:    Component Value Date/Time   COLORURINE YELLOW 07/12/2016 1019   APPEARANCEUR CLEAR 07/12/2016 1019   LABSPEC 1.027 07/12/2016 1019   PHURINE 5.0 07/12/2016 1019   GLUCOSEU NEGATIVE 07/12/2016 1019   HGBUR NEGATIVE 07/12/2016 1019   BILIRUBINUR NEGATIVE 07/12/2016 1019   KETONESUR 80 (A) 07/12/2016  1019   PROTEINUR NEGATIVE 07/12/2016 1019   UROBILINOGEN 0.2 12/10/2014 2041   NITRITE NEGATIVE 07/12/2016 1019   LEUKOCYTESUR NEGATIVE 07/12/2016 1019    Radiological Exams on Admission: Ct Entero Abd/pelvis W Contast  Addendum Date: 09/12/2016   ADDENDUM REPORT: 09/12/2016 15:24 ADDENDUM: Immediately proximal to the area of abrupt small bowel narrowing in the distal ileum is a 3.8 cm diverticular like outpouching from the small bowel (seen on image 127 of series 3 and well demonstrated on coronal image 35 of series 601). 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 No other diverticular disease is noted in the small bowel or colon. I discussed these findings with the patient who reports that she has had recurrent bouts of these symptoms since 2016. 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 In the setting of recurrent stricturing/chronic obstruction, perhaps the focal diverticular type structure just proximal to  this stricture is secondary. Electronically Signed   By: Kennith Center M.D.   On: 09/12/2016 15:24   Result Date: 09/12/2016 CLINICAL DATA:  History of small-bowel obstruction. Periumbilical pain. EXAM: CT ABDOMEN AND PELVIS WITH CONTRAST (ENTEROGRAPHY) TECHNIQUE: Multidetector CT of the abdomen and pelvis during bolus administration of intravenous contrast. Negative oral contrast was given. CONTRAST:  ISOVUE-300 IOPAMIDOL (ISOVUE-300) INJECTION 61% COMPARISON:  07/12/2016 FINDINGS: The patient was unable to tolerate oral contrast on today's study, vomiting after consuming the second of 2 bottles. Lower  chest:  Unremarkable. Hepatobiliary: No focal abnormality within the liver parenchyma. There is no evidence for gallstones, gallbladder wall thickening, or pericholecystic fluid. No intrahepatic or extrahepatic biliary dilation. Pancreas: No focal mass lesion. No dilatation of the main duct. No intraparenchymal cyst. No peripancreatic edema. Spleen: No splenomegaly. No focal mass lesion. Adrenals/Urinary Tract: No adrenal nodule or mass.Kidneys are unremarkable. No evidence for hydroureter. The urinary bladder appears normal for the degree of distention. Stomach/Bowel: Stomach is nondistended. No gastric wall thickening. No evidence of outlet obstruction. No abnormal enhancement in the stomach. Duodenum is normal without wall thickening or abnormal enhancement. Jejunal loops are not well distended but show no abnormal enhancement. Mid and distal small bowel, mainly representing ileum is fluid-filled and distended measuring up to 3.6 cm diameter distally. At about 50 cm proximal to the ileocecal valve there is an abrupt transition from normal bowel wall to a long segment of luminal narrowing and inner wall hyperenhancement. The abnormal inner wall hyper enhancement luminal narrowing extends into the terminal ileum and the degree of inner wall/mucosal hyper enhancement is most prominent at the level of the terminal ileum. The ileum proximal to the long abnormal segment is dilated and demonstrates a small bowel feces sign which is compatible with stasis. Together, these imaging features are highly suggestive of an inflammatory stricture. The appendix is normal.  The colon is normal. Vascular/Lymphatic: No abdominal aortic aneurysm. No abdominal aortic atherosclerotic calcification. There is no gastrohepatic or hepatoduodenal ligament lymphadenopathy. Mild lymphadenopathy noted in the ileocolic mesenteric, similar to prior. No pelvic sidewall lymphadenopathy. Reproductive: The uterus has normal CT imaging appearance.  There is no adnexal mass. Other: Small volume intraperitoneal free fluid noted in the cul-de-sac. Musculoskeletal: Bone windows reveal no worrisome lytic or sclerotic osseous lesions. IMPRESSION: 1. Approximately 40- 50 cm segment of abnormal distal ileum extends into the terminal ileum. This segment shows inner wall hyper enhancement, most prominent in the terminal ileum, and marked luminal narrowing. Small bowel just proximal to this segment is dilated and shows a small bowel feces sign consistent with intraluminal stasis. The abnormal distal ileum shows an ill-defined wall with mild thickening and subtle interloop mesenteric fluid. Together, imaging features are highly suspicious for inflammatory stricture and compatible with inflammatory bowel disease. No evidence for perforation, fistula, or abscess at this time. 2. Mild lymphadenopathy in the ileocolic mesentery. I personally called Dr. Loreta Ave immediately after study interpretation to discuss these results. Electronically Signed: By: Kennith Center M.D. On: 09/12/2016 15:02    EKG: Independently reviewed. Sinus rhythm with no ST elevations or depressions  Assessment/Plan Active Problems:   Nausea & vomiting  - per my discussion with gastroenterologist patient had capsule endoscopy which revealed terminal ileum stricture. Further recommendations they recommended admitting, hydrating, and consulting general surgery of which gastroenterology reports they have artery completed. - Place on D5 normal saline - Continue supportive therapy with fentanyl - Anti-emetics IV  DVT prophylaxis: Heparin Code Status: full Family Communication:  d/c patient and family member at bedside Disposition Plan: If general surgery is planning operation on this otherwise healthy 26 year old would appreciate it if a planned on taking over care Consults called: General surgery, gastroenterology (Dr. Loreta Ave) Admission status: obs   Penny Pia MD [facility] Pager  [phone]  If 7PM-7AM, please contact night-coverage [email or web address] Password [email or web address]  09/12/2016, 5:46 PM

## 2016-09-13 DIAGNOSIS — K5090 Crohn's disease, unspecified, without complications: Secondary | ICD-10-CM | POA: Diagnosis present

## 2016-09-13 DIAGNOSIS — D72829 Elevated white blood cell count, unspecified: Secondary | ICD-10-CM | POA: Diagnosis present

## 2016-09-13 DIAGNOSIS — Z79899 Other long term (current) drug therapy: Secondary | ICD-10-CM | POA: Diagnosis not present

## 2016-09-13 DIAGNOSIS — R112 Nausea with vomiting, unspecified: Secondary | ICD-10-CM | POA: Diagnosis not present

## 2016-09-13 DIAGNOSIS — F17210 Nicotine dependence, cigarettes, uncomplicated: Secondary | ICD-10-CM | POA: Diagnosis present

## 2016-09-13 DIAGNOSIS — K56609 Unspecified intestinal obstruction, unspecified as to partial versus complete obstruction: Secondary | ICD-10-CM | POA: Diagnosis present

## 2016-09-13 DIAGNOSIS — Z79891 Long term (current) use of opiate analgesic: Secondary | ICD-10-CM | POA: Diagnosis not present

## 2016-09-13 DIAGNOSIS — Z885 Allergy status to narcotic agent status: Secondary | ICD-10-CM | POA: Diagnosis not present

## 2016-09-13 LAB — CBC
HEMATOCRIT: 38.4 % (ref 36.0–46.0)
HEMOGLOBIN: 12.7 g/dL (ref 12.0–15.0)
MCH: 29.6 pg (ref 26.0–34.0)
MCHC: 33.1 g/dL (ref 30.0–36.0)
MCV: 89.5 fL (ref 78.0–100.0)
Platelets: 340 10*3/uL (ref 150–400)
RBC: 4.29 MIL/uL (ref 3.87–5.11)
RDW: 12.8 % (ref 11.5–15.5)
WBC: 12.6 10*3/uL — ABNORMAL HIGH (ref 4.0–10.5)

## 2016-09-13 LAB — BASIC METABOLIC PANEL
ANION GAP: 9 (ref 5–15)
BUN: 6 mg/dL (ref 6–20)
CALCIUM: 8.4 mg/dL — AB (ref 8.9–10.3)
CO2: 22 mmol/L (ref 22–32)
Chloride: 104 mmol/L (ref 101–111)
Creatinine, Ser: 0.58 mg/dL (ref 0.44–1.00)
GFR calc non Af Amer: >60 mL/min (ref >60)
GLUCOSE: 172 mg/dL — AB (ref 65–99)
POTASSIUM: 4 mmol/L (ref 3.5–5.1)
Sodium: 135 mmol/L (ref 135–145)

## 2016-09-13 MED ORDER — METHYLPREDNISOLONE SODIUM SUCC 40 MG IJ SOLR
40.0000 mg | Freq: Two times a day (BID) | INTRAMUSCULAR | Status: DC
  Administered 2016-09-13 – 2016-09-15 (×4): 40 mg via INTRAVENOUS
  Filled 2016-09-13 (×4): qty 1

## 2016-09-13 MED ORDER — PANTOPRAZOLE SODIUM 40 MG IV SOLR
40.0000 mg | INTRAVENOUS | Status: DC
  Administered 2016-09-13 – 2016-09-15 (×3): 40 mg via INTRAVENOUS
  Filled 2016-09-13 (×3): qty 40

## 2016-09-13 NOTE — Consult Note (Signed)
Reason for Consult: SBO, ? Crohn's disease Referring Physician: [facility]  Fredderick Erb HPI: This is a 26 year old female without any significant PMH admitted for a SBO.  The patient was initially evaluated by Dr. Loreta Ave on 07/31/2016 for complaints of abdominal pain.  A CT scan in the ER revealed a possible SBO on 07/12/2016.  The source of the potential SBO was not known and a capsule endoscopy was performed, which was read as normal.  Further evaluation with a CTE with continued abdominal pain revealed that there is a 50 cm segment of distal small bowel that was strictured and there was proximal dilation.  Mucosal inflammation was noted at the TI.  As a result of her symptoms, she was admitted to the hospital for further evaluation and treatment.  Since her admission she received one dose of Solumedrol and she denies any further abdominal pain since her last dose of pain medication at 8 PM last evening.  Past Medical History:  Diagnosis Date  . Daily headache    "recently" (09/12/2016)  . Small bowel obstruction (HCC) 07/12/2016    Past Surgical History:  Procedure Laterality Date  . AGILE CAPSULE N/A 08/15/2016   Procedure: AGILE CAPSULE;  Surgeon: Charna Elizabeth, MD;  Location: [facility] ENDOSCOPY;  Service: Endoscopy;  Laterality: N/A;  . GIVENS CAPSULE STUDY N/A 08/21/2016   Procedure: GIVENS CAPSULE STUDY;  Surgeon: Charna Elizabeth, MD;  Location: [facility] ENDOSCOPY;  Service: Endoscopy;  Laterality: N/A;  . WISDOM TOOTH EXTRACTION  ~ 2011    History reviewed. No pertinent family history.  Social History:  reports that she has been smoking Cigarettes.  She has a 3.00 pack-year smoking history. She has never used smokeless tobacco. She reports that she drinks about 6.6 oz of alcohol per week . She reports that she does not use drugs.  Allergies:  Allergies  Allergen Reactions  . Morphine And Related Hives    Medications:  Scheduled: . heparin  5,000 Units Subcutaneous Q8H  . methylPREDNISolone  (SOLU-MEDROL) injection  60 mg Intravenous Daily  . pantoprazole (PROTONIX) IV  40 mg Intravenous Q24H   Continuous: . dextrose 5 % and 0.9% NaCl 100 mL/hr at 09/13/16 0410    Results for orders placed or performed during the hospital encounter of 09/12/16 (from the past 24 hour(s))  Comprehensive metabolic panel     Status: Abnormal   Collection Time: 09/12/16  6:09 PM  Result Value Ref Range   Sodium 134 (L) 135 - 145 mmol/L   Potassium 3.8 3.5 - 5.1 mmol/L   Chloride 101 101 - 111 mmol/L   CO2 20 (L) 22 - 32 mmol/L   Glucose, Bld 111 (H) 65 - 99 mg/dL   BUN 7 6 - 20 mg/dL   Creatinine, Ser 8.29 0.44 - 1.00 mg/dL   Calcium 9.4 8.9 - 56.2 mg/dL   Total Protein 8.0 6.5 - 8.1 g/dL   Albumin 4.2 3.5 - 5.0 g/dL   AST 26 15 - 41 U/L   ALT 28 14 - 54 U/L   Alkaline Phosphatase 62 38 - 126 U/L   Total Bilirubin 0.9 0.3 - 1.2 mg/dL   GFR calc non Af Amer >60 >60 mL/min   GFR calc Af Amer >60 >60 mL/min   Anion gap 13 5 - 15  Magnesium     Status: None   Collection Time: 09/12/16  6:09 PM  Result Value Ref Range   Magnesium 1.7 1.7 - 2.4 mg/dL  Phosphorus  Status: None   Collection Time: 09/12/16  6:09 PM  Result Value Ref Range   Phosphorus 3.6 2.5 - 4.6 mg/dL  CBC WITH DIFFERENTIAL     Status: Abnormal   Collection Time: 09/12/16  6:09 PM  Result Value Ref Range   WBC 17.0 (H) 4.0 - 10.5 K/uL   RBC 5.06 3.87 - 5.11 MIL/uL   Hemoglobin 15.1 (H) 12.0 - 15.0 g/dL   HCT 16.1 09.6 - 04.5 %   MCV 88.7 78.0 - 100.0 fL   MCH 29.8 26.0 - 34.0 pg   MCHC 33.6 30.0 - 36.0 g/dL   RDW 40.9 81.1 - 91.4 %   Platelets 371 150 - 400 K/uL   Neutrophils Relative % 88 %   Neutro Abs 14.9 (H) 1.7 - 7.7 K/uL   Lymphocytes Relative 8 %   Lymphs Abs 1.4 0.7 - 4.0 K/uL   Monocytes Relative 4 %   Monocytes Absolute 0.7 0.1 - 1.0 K/uL   Eosinophils Relative 0 %   Eosinophils Absolute 0.0 0.0 - 0.7 K/uL   Basophils Relative 0 %   Basophils Absolute 0.0 0.0 - 0.1 K/uL  TSH     Status: None    Collection Time: 09/12/16  6:09 PM  Result Value Ref Range   TSH 3.137 0.350 - 4.500 uIU/mL  Basic metabolic panel     Status: Abnormal   Collection Time: 09/13/16  3:48 AM  Result Value Ref Range   Sodium 135 135 - 145 mmol/L   Potassium 4.0 3.5 - 5.1 mmol/L   Chloride 104 101 - 111 mmol/L   CO2 22 22 - 32 mmol/L   Glucose, Bld 172 (H) 65 - 99 mg/dL   BUN 6 6 - 20 mg/dL   Creatinine, Ser 7.82 0.44 - 1.00 mg/dL   Calcium 8.4 (L) 8.9 - 10.3 mg/dL   GFR calc non Af Amer >60 >60 mL/min   GFR calc Af Amer >60 >60 mL/min   Anion gap 9 5 - 15  CBC     Status: Abnormal   Collection Time: 09/13/16  3:48 AM  Result Value Ref Range   WBC 12.6 (H) 4.0 - 10.5 K/uL   RBC 4.29 3.87 - 5.11 MIL/uL   Hemoglobin 12.7 12.0 - 15.0 g/dL   HCT 95.6 21.3 - 08.6 %   MCV 89.5 78.0 - 100.0 fL   MCH 29.6 26.0 - 34.0 pg   MCHC 33.1 30.0 - 36.0 g/dL   RDW 57.8 46.9 - 62.9 %   Platelets 340 150 - 400 K/uL     Ct Entero Abd/pelvis W Contast  Addendum Date: 09/12/2016   ADDENDUM REPORT: 09/12/2016 15:24 ADDENDUM: Immediately proximal to the area of abrupt small bowel narrowing in the distal ileum is a 3.8 cm diverticular like outpouching from the small bowel (seen on image 127 of series 3 and well demonstrated on coronal image 35 of series 601). 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 No other diverticular disease is noted in the small bowel or colon. I discussed these findings with the patient who reports that she has had recurrent bouts of these symptoms since 2016. 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 In the  setting of recurrent stricturing/chronic obstruction, perhaps the focal diverticular type structure just proximal to this stricture is secondary. Electronically Signed   By: Kennith Center M.D.   On: 09/12/2016 15:24   Result Date: 09/12/2016 CLINICAL DATA:  History of small-bowel obstruction. Periumbilical pain. EXAM: CT ABDOMEN AND PELVIS WITH CONTRAST (ENTEROGRAPHY) TECHNIQUE: Multidetector CT of the abdomen and pelvis during bolus administration of intravenous contrast. Negative oral contrast was given. CONTRAST:  ISOVUE-300 IOPAMIDOL (ISOVUE-300) INJECTION 61% COMPARISON:  07/12/2016 FINDINGS: The patient was unable to tolerate oral contrast on today's study, vomiting after consuming the second of 2 bottles. Lower chest:  Unremarkable. Hepatobiliary: No focal abnormality within the liver parenchyma. There is no evidence for gallstones, gallbladder wall thickening, or pericholecystic fluid. No intrahepatic or extrahepatic biliary dilation. Pancreas: No focal mass lesion. No dilatation of the main duct. No intraparenchymal cyst. No peripancreatic edema. Spleen: No splenomegaly. No focal mass lesion. Adrenals/Urinary Tract: No adrenal nodule or mass.Kidneys are unremarkable. No evidence for hydroureter. The urinary bladder appears normal for the degree of distention. Stomach/Bowel: Stomach is nondistended. No gastric wall thickening. No evidence of outlet obstruction. No abnormal enhancement in the stomach. Duodenum is normal without wall thickening or abnormal enhancement. Jejunal loops are not well distended but show no abnormal enhancement. Mid and distal small bowel, mainly representing ileum is fluid-filled and distended measuring up to 3.6 cm diameter distally. At about 50 cm proximal to the ileocecal valve there is an abrupt transition from normal bowel wall to a long segment of luminal narrowing and inner wall hyperenhancement. The abnormal inner wall hyper enhancement luminal narrowing extends into  the terminal ileum and the degree of inner wall/mucosal hyper enhancement is most prominent at the level of the terminal ileum. The ileum proximal to the long abnormal segment is dilated and demonstrates a small bowel feces sign which is compatible with stasis. Together, these imaging features are highly suggestive of an inflammatory stricture. The appendix is normal.  The colon is normal. Vascular/Lymphatic: No abdominal aortic aneurysm. No abdominal aortic atherosclerotic calcification. There is no gastrohepatic or hepatoduodenal ligament lymphadenopathy. Mild lymphadenopathy noted in the ileocolic mesenteric, similar to prior. No pelvic sidewall lymphadenopathy. Reproductive: The uterus has normal CT imaging appearance. There is no adnexal mass. Other: Small volume intraperitoneal free fluid noted in the cul-de-sac. Musculoskeletal: Bone windows reveal no worrisome lytic or sclerotic osseous lesions. IMPRESSION: 1. Approximately 40- 50 cm segment of abnormal distal ileum extends into the terminal ileum. This segment shows inner wall hyper enhancement, most prominent in the terminal ileum, and marked luminal narrowing. Small bowel just proximal to this segment is dilated and shows a small bowel feces sign consistent with intraluminal stasis. The abnormal distal ileum shows an ill-defined wall with mild thickening and subtle interloop mesenteric fluid. Together, imaging features are highly suspicious for inflammatory stricture and compatible with inflammatory bowel disease. No evidence for perforation, fistula, or abscess at this time. 2. Mild lymphadenopathy in the ileocolic mesentery. I personally called Dr. Loreta Ave immediately after study interpretation to discuss these results. Electronically Signed: By: Kennith Center M.D. On: 09/12/2016 15:02    ROS:  As stated above in the HPI otherwise negative.  Blood pressure (!) 106/51, pulse (!) 57, temperature 98.6 F (37 C), temperature source Oral, resp. rate 18,  height 5\' 6"  (1.676 m), weight 70.3 kg (154 lb 14.4 oz), last menstrual period 08/22/2016, SpO2 99 %.    PE: Gen: NAD, Alert  and Oriented HEENT:  [place]/AT, EOMI Neck: Supple, no LAD Lungs: CTA Bilaterally CV: RRR without M/G/R ABM: Soft, mid abdominal ptenderness, +BS Ext: No C/C/E  Assessment/Plan: 1) SBO. 2) Ileitis. 3) Nausea/vomiting. 4) ABM pain.   The CTE findings are highly suspicious for Crohn's disease.  Surgery has evaluated the patient any they want to continue with conservative management with steroids, which is appropriate  No prior colonoscopy and she will require this procedure.  Unfortunately, she denies any bowel movements at this time and I al reluctant to prep her with a possible persistent SBO.  It is possible that medical management can resolve this issue.  I agree with Dr. Huntley Estelle reading that the stricture can be fibrostenotic.  In this instance, steroids will only have limited efficacy.  Plan: 1) Okay with clear liquid diet. 2) Change Solumedrol to 40 mg BID.  Stephanie Ware D 09/13/2016, 12:29 PM

## 2016-09-13 NOTE — Progress Notes (Signed)
PROGRESS NOTE    Stephanie Ware  OOI:[id number] DOB: 1990/10/02 DOA: 09/12/2016 PCP: Richmond Campbell., PA-C   Brief Narrative: Stephanie Ware is a 26 y/o female with past medical history of small bowel obstruction, last hospitalized in April and headaches. Patient was seen by Dr. 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 Patient was sent to the hospital for admission and surgery consult.  At this time she is being treated supportively with steroids and NPO.  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 Seen by general surgery they recommended bowel rest with nothing by mouth. Patient has no NG tube currently. -Added IV Protonix while nothing by mouth -Continue IV fluid/ - NG tube not currently needed but will use if she has more episodes of emesis.  -Currently on IV Solu-Medrol for possible bowel inflammation. Continue supportive care -Follow-up GIs plan. Dr Loreta Ave was called and left messages  # Leukocytosis likely stress related. WBC count is trending down. Patient has no fever and clinically stable. Continue to monitor for now.  DVT prophylaxis: Heparin Code Status: full Family Communication: no family members at bedside Disposition Plan: inpatient for now-Home when clinically improved.  Consultants:   [facility]  GI ( voice message left to Dr. Loreta Ave)  Procedures:  None  Antimicrobials:  None   Subjective: Patient resting comfortably in bed, states she is feeling better and pain has improved. Denied nausea vomiting, chest pain or shortness of breath.  Objective: Vitals:   09/12/16 1632 09/12/16 2101 09/13/16 0441  BP: (!) 147/76 123/63 (!) 106/51  Pulse: 62 (!) 58 (!) 57  Resp: 18 18 18   Temp: 98.1 F (36.7 C) 98.8 F (37.1 C) 98.6 F (37 C)  TempSrc: Oral Oral  Oral  SpO2: 100% 99% 99%  Weight: 70.3 kg (154 lb 14.4 oz)    Height: 5\' 6"  (1.676 m)      Intake/Output Summary (Last 24 hours) at 09/13/16 1045 Last data filed at 09/13/16 1004  Gross per 24 hour  Intake          1014.67 ml  Output              800 ml  Net           214.67 ml   Filed Weights   09/12/16 1632  Weight: 70.3 kg (154 lb 14.4 oz)    Examination:  General exam: Appears calm and comfortable  Respiratory system: Clear to auscultation. Respiratory effort normal. Cardiovascular system: S1 & S2 heard, RRR. No JVD, murmurs, rubs, gallops or clicks. No pedal edema. Gastrointestinal system: Abdomen is slightly distended, soft and mildly TTP in all 4 quadrants. Hypoactive bowel sounds in all 4 quadrants. Central nervous system: Alert and oriented. No focal neurological deficits. Skin: No rashes, lesions or ulcers Psychiatry: Judgement and insight appear normal. Mood & affect appropriate.     Data Reviewed: I have personally reviewed following labs and imaging studies  CBC:  Recent Labs Lab 09/12/16 1809 09/13/16 0348  WBC 17.0* 12.6*  NEUTROABS 14.9*  --   HGB 15.1* 12.7  HCT 44.9 38.4  MCV 88.7 89.5  PLT 371 340   Basic Metabolic Panel:  Recent Labs Lab 09/12/16 1809 09/13/16 0348  NA 134* 135  K 3.8 4.0  CL 101 104  CO2 20* 22  GLUCOSE  111* 172*  BUN 7 6  CREATININE 0.65 0.58  CALCIUM 9.4 8.4*  MG 1.7  --   PHOS 3.6  --    GFR: Estimated Creatinine Clearance: 101.5 mL/min (by C-G formula based on SCr of 0.58 mg/dL). Liver Function Tests:  Recent Labs Lab 09/12/16 1809  AST 26  ALT 28  ALKPHOS 62  BILITOT 0.9  PROT 8.0  ALBUMIN 4.2   No results for input(s): LIPASE, AMYLASE in the last 168 hours. No results for input(s): AMMONIA in the last 168 hours. Coagulation Profile: No results for input(s): INR, PROTIME in the last 168 hours. Cardiac Enzymes: No results for input(s): CKTOTAL, CKMB, CKMBINDEX, TROPONINI in the last 168  hours. BNP (last 3 results) No results for input(s): PROBNP in the last 8760 hours. HbA1C: No results for input(s): HGBA1C in the last 72 hours. CBG: No results for input(s): GLUCAP in the last 168 hours. Lipid Profile: No results for input(s): CHOL, HDL, LDLCALC, TRIG, CHOLHDL, LDLDIRECT in the last 72 hours. Thyroid Function Tests:  Recent Labs  09/12/16 1809  TSH 3.137   Anemia Panel: No results for input(s): VITAMINB12, FOLATE, FERRITIN, TIBC, IRON, RETICCTPCT in the last 72 hours. Sepsis Labs: No results for input(s): PROCALCITON, LATICACIDVEN in the last 168 hours.  No results found for this or any previous visit (from the past 240 hour(s)).       Radiology Studies: Ct Entero Abd/pelvis W Contast  Addendum Date: 09/12/2016   ADDENDUM REPORT: 09/12/2016 15:24 ADDENDUM: Immediately proximal to the area of abrupt small bowel narrowing in the distal ileum is a 3.8 cm diverticular like outpouching from the small bowel (seen on image 127 of series 3 and well demonstrated on coronal image 35 of series 601). 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 No other diverticular disease is noted in the small bowel or colon. I discussed these findings with the patient who reports that she has had recurrent bouts of these symptoms since 2016. 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 In the setting of recurrent stricturing/chronic obstruction, perhaps the focal diverticular type structure just proximal to this stricture is secondary. Electronically Signed   By: Kennith Center M.D.   On: 09/12/2016 15:24    Result Date: 09/12/2016 CLINICAL DATA:  History of small-bowel obstruction. Periumbilical pain. EXAM: CT ABDOMEN AND PELVIS WITH CONTRAST (ENTEROGRAPHY) TECHNIQUE: Multidetector CT of the abdomen and pelvis during bolus administration of intravenous contrast. Negative oral contrast was given. CONTRAST:  ISOVUE-300 IOPAMIDOL (ISOVUE-300) INJECTION 61% COMPARISON:  07/12/2016 FINDINGS: The patient was unable to tolerate oral contrast on today's study, vomiting after consuming the second of 2 bottles. Lower chest:  Unremarkable. Hepatobiliary: No focal abnormality within the liver parenchyma. There is no evidence for gallstones, gallbladder wall thickening, or pericholecystic fluid. No intrahepatic or extrahepatic biliary dilation. Pancreas: No focal mass lesion. No dilatation of the main duct. No intraparenchymal cyst. No peripancreatic edema. Spleen: No splenomegaly. No focal mass lesion. Adrenals/Urinary Tract: No adrenal nodule or mass.Kidneys are unremarkable. No evidence for hydroureter. The urinary bladder appears normal for the degree of distention. Stomach/Bowel: Stomach is nondistended. No gastric wall thickening. No evidence of  outlet obstruction. No abnormal enhancement in the stomach. Duodenum is normal without wall thickening or abnormal enhancement. Jejunal loops are not well distended but show no abnormal enhancement. Mid and distal small bowel, mainly representing ileum is fluid-filled and distended measuring up to 3.6 cm diameter distally. At about 50 cm proximal to the ileocecal valve there is an abrupt transition from normal bowel wall to a long segment of luminal narrowing and inner wall hyperenhancement. The abnormal inner wall hyper enhancement luminal narrowing extends into the terminal ileum and the degree of inner wall/mucosal hyper enhancement is most prominent at the level of the terminal ileum. The ileum proximal to the long abnormal segment is dilated and demonstrates a small  bowel feces sign which is compatible with stasis. Together, these imaging features are highly suggestive of an inflammatory stricture. The appendix is normal.  The colon is normal. Vascular/Lymphatic: No abdominal aortic aneurysm. No abdominal aortic atherosclerotic calcification. There is no gastrohepatic or hepatoduodenal ligament lymphadenopathy. Mild lymphadenopathy noted in the ileocolic mesenteric, similar to prior. No pelvic sidewall lymphadenopathy. Reproductive: The uterus has normal CT imaging appearance. There is no adnexal mass. Other: Small volume intraperitoneal free fluid noted in the cul-de-sac. Musculoskeletal: Bone windows reveal no worrisome lytic or sclerotic osseous lesions. IMPRESSION: 1. Approximately 40- 50 cm segment of abnormal distal ileum extends into the terminal ileum. This segment shows inner wall hyper enhancement, most prominent in the terminal ileum, and marked luminal narrowing. Small bowel just proximal to this segment is dilated and shows a small bowel feces sign consistent with intraluminal stasis. The abnormal distal ileum shows an ill-defined wall with mild thickening and subtle interloop mesenteric fluid. Together, imaging features are highly suspicious for inflammatory stricture and compatible with inflammatory bowel disease. No evidence for perforation, fistula, or abscess at this time. 2. Mild lymphadenopathy in the ileocolic mesentery. I personally called Dr. Loreta Ave immediately after study interpretation to discuss these results. Electronically Signed: By: Kennith Center M.D. On: 09/12/2016 15:02        Scheduled Meds: . heparin  5,000 Units Subcutaneous Q8H  . methylPREDNISolone (SOLU-MEDROL) injection  60 mg Intravenous Daily  . pantoprazole (PROTONIX) IV  40 mg Intravenous Q24H   Continuous Infusions: . dextrose 5 % and 0.9% NaCl 100 mL/hr at 09/13/16 0410     LOS: 1 day    Time spent: 25 minutes    Willa Frater, PA-S [facility] Pager  [phone]  If 7PM-7AM, please contact night-coverage [email or web address] Password [email or web address] 09/13/2016, 10:45 AM

## 2016-09-13 NOTE — Progress Notes (Signed)
[facility]/Trauma Progress Note      Subjective:  CC: abdominal bloating  Pt states no abdominal pain but endorses bloating. No vomiting overnight, no flatus or hiccups. Mild belching. Pt states she slept well.   Objective: Vital signs in last 24 hours: Temp:  [98.1 F (36.7 C)-98.8 F (37.1 C)] 98.6 F (37 C) (06/07 0441) Pulse Rate:  [57-62] 57 (06/07 0441) Resp:  [18] 18 (06/07 0441) BP: (106-147)/(51-76) 106/51 (06/07 0441) SpO2:  [99 %-100 %] 99 % (06/07 0441) Weight:  [154 lb 14.4 oz (70.3 kg)] 154 lb 14.4 oz (70.3 kg) (06/06 1632) Last BM Date: 09/12/16  Intake/Output from previous day: 06/06 0701 - 06/07 0700 In: 1014.7 [P.O.:3; I.V.:1011.7] Out: 800 [Urine:800] Intake/Output this shift: No intake/output data recorded.  PE: Gen:  Alert, NAD, pleasant, cooperative, well appearing Card:  RRR, no M/G/R heard Pulm:  CTA, no W/R/R, effort normal Abd: Soft, not distended, hypoactive BS, no hernia appreciated, mild TTP periumbilicus  Skin: no rashes noted, warm and dry  Lab Results:   Recent Labs  09/12/16 1809 09/13/16 0348  WBC 17.0* 12.6*  HGB 15.1* 12.7  HCT 44.9 38.4  PLT 371 340   BMET  Recent Labs  09/12/16 1809 09/13/16 0348  NA 134* 135  K 3.8 4.0  CL 101 104  CO2 20* 22  GLUCOSE 111* 172*  BUN 7 6  CREATININE 0.65 0.58  CALCIUM 9.4 8.4*   PT/INR No results for input(s): LABPROT, INR in the last 72 hours. CMP     Component Value Date/Time   NA 135 09/13/2016 0348   K 4.0 09/13/2016 0348   CL 104 09/13/2016 0348   CO2 22 09/13/2016 0348   GLUCOSE 172 (H) 09/13/2016 0348   BUN 6 09/13/2016 0348   CREATININE 0.58 09/13/2016 0348   CALCIUM 8.4 (L) 09/13/2016 0348   PROT 8.0 09/12/2016 1809   ALBUMIN 4.2 09/12/2016 1809   AST 26 09/12/2016 1809   ALT 28 09/12/2016 1809   ALKPHOS 62 09/12/2016 1809   BILITOT 0.9 09/12/2016 1809   GFRNONAA >60 09/13/2016 0348   GFRAA >60 09/13/2016 0348   Lipase     Component  Value Date/Time   LIPASE 13 07/12/2016 0754    Studies/Results: Ct Entero Abd/pelvis W Contast  Addendum Date: 09/12/2016   ADDENDUM REPORT: 09/12/2016 15:24 ADDENDUM: Immediately proximal to the area of abrupt small bowel narrowing in the distal ileum is a 3.8 cm diverticular like outpouching from the small bowel (seen on image 127 of series 3 and well demonstrated on coronal image 35 of series 601). 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 No other diverticular disease is noted in the small bowel or colon. I discussed these findings with the patient who reports that she has had recurrent bouts of these symptoms since 2016. 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 In the setting of recurrent stricturing/chronic obstruction, perhaps the focal diverticular type structure just proximal to this stricture is secondary. Electronically Signed   By: Kennith Center M.D.   On: 09/12/2016 15:24   Result Date: 09/12/2016 CLINICAL DATA:  History of small-bowel obstruction. Periumbilical pain. EXAM:  CT ABDOMEN AND PELVIS WITH CONTRAST (ENTEROGRAPHY) TECHNIQUE: Multidetector CT of the abdomen and pelvis during bolus administration of intravenous contrast. Negative oral contrast was given. CONTRAST:  ISOVUE-300 IOPAMIDOL (ISOVUE-300) INJECTION 61% COMPARISON:  07/12/2016 FINDINGS: The patient was unable to tolerate oral contrast on today's study, vomiting after consuming the second of 2 bottles. Lower chest:  Unremarkable. Hepatobiliary: No focal abnormality within the liver parenchyma. There is no evidence for gallstones,  gallbladder wall thickening, or pericholecystic fluid. No intrahepatic or extrahepatic biliary dilation. Pancreas: No focal mass lesion. No dilatation of the main duct. No intraparenchymal cyst. No peripancreatic edema. Spleen: No splenomegaly. No focal mass lesion. Adrenals/Urinary Tract: No adrenal nodule or mass.Kidneys are unremarkable. No evidence for hydroureter. The urinary bladder appears normal for the degree of distention. Stomach/Bowel: Stomach is nondistended. No gastric wall thickening. No evidence of outlet obstruction. No abnormal enhancement in the stomach. Duodenum is normal without wall thickening or abnormal enhancement. Jejunal loops are not well distended but show no abnormal enhancement. Mid and distal small bowel, mainly representing ileum is fluid-filled and distended measuring up to 3.6 cm diameter distally. At about 50 cm proximal to the ileocecal valve there is an abrupt transition from normal bowel wall to a long segment of luminal narrowing and inner wall hyperenhancement. The abnormal inner wall hyper enhancement luminal narrowing extends into the terminal ileum and the degree of inner wall/mucosal hyper enhancement is most prominent at the level of the terminal ileum. The ileum proximal to the long abnormal segment is dilated and demonstrates a small bowel feces sign which is compatible with stasis. Together, these imaging features are highly suggestive of an inflammatory stricture. The appendix is normal.  The colon is normal. Vascular/Lymphatic: No abdominal aortic aneurysm. No abdominal aortic atherosclerotic calcification. There is no gastrohepatic or hepatoduodenal ligament lymphadenopathy. Mild lymphadenopathy noted in the ileocolic mesenteric, similar to prior. No pelvic sidewall lymphadenopathy. Reproductive: The uterus has normal CT imaging appearance. There is no adnexal mass. Other: Small volume intraperitoneal free fluid noted in the cul-de-sac. Musculoskeletal: Bone  windows reveal no worrisome lytic or sclerotic osseous lesions. IMPRESSION: 1. Approximately 40- 50 cm segment of abnormal distal ileum extends into the terminal ileum. This segment shows inner wall hyper enhancement, most prominent in the terminal ileum, and marked luminal narrowing. Small bowel just proximal to this segment is dilated and shows a small bowel feces sign consistent with intraluminal stasis. The abnormal distal ileum shows an ill-defined wall with mild thickening and subtle interloop mesenteric fluid. Together, imaging features are highly suspicious for inflammatory stricture and compatible with inflammatory bowel disease. No evidence for perforation, fistula, or abscess at this time. 2. Mild lymphadenopathy in the ileocolic mesentery. I personally called Dr. Loreta Ave immediately after study interpretation to discuss these results. Electronically Signed: By: Kennith Center M.D. 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 If pt has continues vomiting then will need an NG tube. NPO with chips until flatus    LOS: 1 day    Jerre Simon , [facility] Surgery 09/13/2016, 9:20 AM  Pager: [phone] Consults: [phone] Mon-Fri 7:00 am-4:30 pm Sat-Sun 7:00 am-11:30 am

## 2016-09-14 NOTE — Progress Notes (Signed)
Subjective: Feels well.  + bowel movements and tolerated liquid diet  Objective: Vital signs in last 24 hours: Temp:  [97.7 F (36.5 C)-98 F (36.7 C)] 97.9 F (36.6 C) (06/08 0521) Pulse Rate:  [51-81] 51 (06/08 0521) Resp:  [18] 18 (06/08 0521) BP: (101-111)/(50-64) 101/50 (06/08 0521) SpO2:  [99 %-100 %] 99 % (06/08 0521) Last BM Date: 09/14/16  Intake/Output from previous day: 06/07 0701 - 06/08 0700 In: 2076.7 [P.O.:360; I.V.:1716.7] Out: 1050 [Urine:1050] Intake/Output this shift: No intake/output data recorded.  General appearance: alert and no distress GI: soft, non-tender; bowel sounds normal; no masses,  no organomegaly  Lab Results:  Recent Labs  09/12/16 1809 09/13/16 0348  WBC 17.0* 12.6*  HGB 15.1* 12.7  HCT 44.9 38.4  PLT 371 340   BMET  Recent Labs  09/12/16 1809 09/13/16 0348  NA 134* 135  K 3.8 4.0  CL 101 104  CO2 20* 22  GLUCOSE 111* 172*  BUN 7 6  CREATININE 0.65 0.58  CALCIUM 9.4 8.4*   LFT  Recent Labs  09/12/16 1809  PROT 8.0  ALBUMIN 4.2  AST 26  ALT 28  ALKPHOS 62  BILITOT 0.9   PT/INR No results for input(s): LABPROT, INR in the last 72 hours. Hepatitis Panel No results for input(s): HEPBSAG, HCVAB, HEPAIGM, HEPBIGM in the last 72 hours. C-Diff No results for input(s): CDIFFTOX in the last 72 hours. Fecal Lactopherrin No results for input(s): FECLLACTOFRN in the last 72 hours.  Studies/Results: Ct Entero Abd/pelvis W Contast  Addendum Date: 09/12/2016   ADDENDUM REPORT: 09/12/2016 15:24 ADDENDUM: Immediately proximal to the area of abrupt small bowel narrowing in the distal ileum is a 3.8 cm diverticular like outpouching from the small bowel (seen on image 127 of series 3 and well demonstrated on coronal image 35 of series 601). While this structure appears impacted with enteric contents, it does not show wall thickening or surrounding edema/inflammation and could represent a Meckel's diverticulum or small bowel  diverticulum. No other diverticular disease is noted in the small bowel or colon. I discussed these findings with the patient who reports that she has had recurrent bouts of these symptoms since 2016. 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 In the setting of recurrent stricturing/chronic obstruction, perhaps the focal diverticular type structure just proximal to this stricture is secondary. Electronically Signed   By: Kennith Center M.D.   On: 09/12/2016 15:24   Result Date: 09/12/2016 CLINICAL DATA:  History of small-bowel obstruction. Periumbilical pain. EXAM: CT ABDOMEN AND PELVIS WITH CONTRAST (ENTEROGRAPHY) TECHNIQUE: Multidetector CT of the abdomen and pelvis during bolus administration of intravenous contrast. Negative oral contrast was given. CONTRAST:  ISOVUE-300 IOPAMIDOL (ISOVUE-300) INJECTION 61% COMPARISON:  07/12/2016 FINDINGS: The patient was unable to tolerate oral contrast on today's study, vomiting after consuming the second of 2 bottles. Lower chest:  Unremarkable. Hepatobiliary: No focal abnormality within the liver parenchyma. There is no evidence for gallstones, gallbladder wall thickening, or pericholecystic fluid. No intrahepatic or extrahepatic biliary dilation. Pancreas: No focal mass lesion. No dilatation of the main duct. No intraparenchymal cyst. No peripancreatic edema. Spleen: No splenomegaly. No focal mass lesion. Adrenals/Urinary Tract: No adrenal nodule or mass.Kidneys are unremarkable. No evidence for hydroureter. The urinary bladder appears normal  for the degree of distention. Stomach/Bowel: Stomach is nondistended. No gastric wall thickening. No evidence of outlet obstruction. No abnormal enhancement in the stomach. Duodenum is normal  without wall thickening or abnormal enhancement. Jejunal loops are not well distended but show no abnormal enhancement. Mid and distal small bowel, mainly representing ileum is fluid-filled and distended measuring up to 3.6 cm diameter distally. At about 50 cm proximal to the ileocecal valve there is an abrupt transition from normal bowel wall to a long segment of luminal narrowing and inner wall hyperenhancement. The abnormal inner wall hyper enhancement luminal narrowing extends into the terminal ileum and the degree of inner wall/mucosal hyper enhancement is most prominent at the level of the terminal ileum. The ileum proximal to the long abnormal segment is dilated and demonstrates a small bowel feces sign which is compatible with stasis. Together, these imaging features are highly suggestive of an inflammatory stricture. The appendix is normal.  The colon is normal. Vascular/Lymphatic: No abdominal aortic aneurysm. No abdominal aortic atherosclerotic calcification. There is no gastrohepatic or hepatoduodenal ligament lymphadenopathy. Mild lymphadenopathy noted in the ileocolic mesenteric, similar to prior. No pelvic sidewall lymphadenopathy. Reproductive: The uterus has normal CT imaging appearance. There is no adnexal mass. Other: Small volume intraperitoneal free fluid noted in the cul-de-sac. Musculoskeletal: Bone windows reveal no worrisome lytic or sclerotic osseous lesions. IMPRESSION: 1. Approximately 40- 50 cm segment of abnormal distal ileum extends into the terminal ileum. This segment shows inner wall hyper enhancement, most prominent in the terminal ileum, and marked luminal narrowing. Small bowel just proximal to this segment is dilated and shows a small bowel feces sign consistent with intraluminal stasis. The abnormal distal ileum shows an ill-defined wall with mild thickening and subtle interloop mesenteric fluid. Together, imaging features are highly suspicious for inflammatory stricture and  compatible with inflammatory bowel disease. No evidence for perforation, fistula, or abscess at this time. 2. Mild lymphadenopathy in the ileocolic mesentery. I personally called Dr. Loreta Ave immediately after study interpretation to discuss these results. Electronically Signed: By: Kennith Center M.D. On: 09/12/2016 15:02    Medications:  Scheduled: . heparin  5,000 Units Subcutaneous Q8H  . methylPREDNISolone (SOLU-MEDROL) injection  40 mg Intravenous Q12H  . pantoprazole (PROTONIX) IV  40 mg Intravenous Q24H   Continuous: . dextrose 5 % and 0.9% NaCl 100 mL/hr at 09/14/16 0959    Assessment/Plan: 1) Probable Crohn's disease. 2) TI stricture.   Clinically she is markedly improved.  She feels well.  I will advance her to a regular diet and if she tolerates the diet she can be discharged home safely tomorrow AM.  I also want her to get another couple of doses of IV steroids.  A colonoscopy can be performed in short order after her discharge as she prefers Dr. Loreta Ave or myself to perform the procedure.  We are not on call this weekend.  In the near future she will need to be started on a biologic agent.  Also, with the high probability of fibrostenotic disease, I feel that she will need to undergo surgery at some point.  Plan: 1) Regular diet. 2) Upon discharge she needs to be on prednisone 40 mg QD.  Please dispense in 10 mg tablets and give her a month's supply.  Dr. Loreta Ave will manage her taper as an outpatient.  LOS: 2 days   Alben Jepsen D 09/14/2016, 12:06 PM

## 2016-09-14 NOTE — Progress Notes (Signed)
[facility]/Trauma Progress Note      Subjective: CC: mild abdominal pain  Pt states mild abdominal pain that resolves with flatus. She had a small BM this morning. She is tolerating her diet. . No vomiting overnight.  Objective: Vital signs in last 24 hours: Temp:  [97.7 F (36.5 C)-98 F (36.7 C)] 97.9 F (36.6 C) (06/08 0521) Pulse Rate:  [51-81] 51 (06/08 0521) Resp:  [18] 18 (06/08 0521) BP: (101-111)/(50-64) 101/50 (06/08 0521) SpO2:  [99 %-100 %] 99 % (06/08 0521) Last BM Date: 09/14/16  Intake/Output from previous day: 06/07 0701 - 06/08 0700 In: 2076.7 [P.O.:360; I.V.:1716.7] Out: 1050 [Urine:1050] Intake/Output this shift: No intake/output data recorded.  PE: Gen:  Alert, NAD, pleasant, cooperative, well appearing Card:  RRR, no M/G/R heard Pulm:  rate and effort normal Abd: Soft, not distended, + BS, no hernia appreciated, mild TTP periumbilicus  Skin: no rashes noted, warm and dry   Lab Results:   Recent Labs  09/12/16 1809 09/13/16 0348  WBC 17.0* 12.6*  HGB 15.1* 12.7  HCT 44.9 38.4  PLT 371 340   BMET  Recent Labs  09/12/16 1809 09/13/16 0348  NA 134* 135  K 3.8 4.0  CL 101 104  CO2 20* 22  GLUCOSE 111* 172*  BUN 7 6  CREATININE 0.65 0.58  CALCIUM 9.4 8.4*   PT/INR No results for input(s): LABPROT, INR in the last 72 hours. CMP     Component Value Date/Time   NA 135 09/13/2016 0348   K 4.0 09/13/2016 0348   CL 104 09/13/2016 0348   CO2 22 09/13/2016 0348   GLUCOSE 172 (H) 09/13/2016 0348   BUN 6 09/13/2016 0348   CREATININE 0.58 09/13/2016 0348   CALCIUM 8.4 (L) 09/13/2016 0348   PROT 8.0 09/12/2016 1809   ALBUMIN 4.2 09/12/2016 1809   AST 26 09/12/2016 1809   ALT 28 09/12/2016 1809   ALKPHOS 62 09/12/2016 1809   BILITOT 0.9 09/12/2016 1809   GFRNONAA >60 09/13/2016 0348   GFRAA >60 09/13/2016 0348   Lipase     Component Value Date/Time   LIPASE 13 07/12/2016 0754    Studies/Results: Ct Entero  Abd/pelvis W Contast  Addendum Date: 09/12/2016   ADDENDUM REPORT: 09/12/2016 15:24 ADDENDUM: Immediately proximal to the area of abrupt small bowel narrowing in the distal ileum is a 3.8 cm diverticular like outpouching from the small bowel (seen on image 127 of series 3 and well demonstrated on coronal image 35 of series 601). 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 No other diverticular disease is noted in the small bowel or colon. I discussed these findings with the patient who reports that she has had recurrent bouts of these symptoms since 2016. 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 In the setting of recurrent stricturing/chronic obstruction, perhaps the focal diverticular type structure just proximal to this stricture is secondary. Electronically Signed   By: Kennith Center M.D.   On: 09/12/2016 15:24   Result Date: 09/12/2016 CLINICAL DATA:  History of small-bowel obstruction. Periumbilical pain. EXAM: CT ABDOMEN AND PELVIS WITH CONTRAST (ENTEROGRAPHY) TECHNIQUE: Multidetector CT of the abdomen and  pelvis during bolus administration of intravenous contrast. Negative oral contrast was given. CONTRAST:  ISOVUE-300 IOPAMIDOL (ISOVUE-300) INJECTION 61% COMPARISON:  07/12/2016 FINDINGS: The patient was unable to tolerate oral contrast on today's study, vomiting after consuming the second of 2 bottles. Lower chest:  Unremarkable. Hepatobiliary: No focal abnormality within the liver parenchyma. There is no evidence for gallstones, gallbladder wall thickening, or pericholecystic fluid. No intrahepatic or  extrahepatic biliary dilation. Pancreas: No focal mass lesion. No dilatation of the main duct. No intraparenchymal cyst. No peripancreatic edema. Spleen: No splenomegaly. No focal mass lesion. Adrenals/Urinary Tract: No adrenal nodule or mass.Kidneys are unremarkable. No evidence for hydroureter. The urinary bladder appears normal for the degree of distention. Stomach/Bowel: Stomach is nondistended. No gastric wall thickening. No evidence of outlet obstruction. No abnormal enhancement in the stomach. Duodenum is normal without wall thickening or abnormal enhancement. Jejunal loops are not well distended but show no abnormal enhancement. Mid and distal small bowel, mainly representing ileum is fluid-filled and distended measuring up to 3.6 cm diameter distally. At about 50 cm proximal to the ileocecal valve there is an abrupt transition from normal bowel wall to a long segment of luminal narrowing and inner wall hyperenhancement. The abnormal inner wall hyper enhancement luminal narrowing extends into the terminal ileum and the degree of inner wall/mucosal hyper enhancement is most prominent at the level of the terminal ileum. The ileum proximal to the long abnormal segment is dilated and demonstrates a small bowel feces sign which is compatible with stasis. Together, these imaging features are highly suggestive of an inflammatory stricture. The appendix is normal.  The colon is normal. Vascular/Lymphatic: No abdominal aortic aneurysm. No abdominal aortic atherosclerotic calcification. There is no gastrohepatic or hepatoduodenal ligament lymphadenopathy. Mild lymphadenopathy noted in the ileocolic mesenteric, similar to prior. No pelvic sidewall lymphadenopathy. Reproductive: The uterus has normal CT imaging appearance. There is no adnexal mass. Other: Small volume intraperitoneal free fluid noted in the cul-de-sac. Musculoskeletal: Bone windows reveal no worrisome lytic or sclerotic osseous lesions. IMPRESSION: 1.  Approximately 40- 50 cm segment of abnormal distal ileum extends into the terminal ileum. This segment shows inner wall hyper enhancement, most prominent in the terminal ileum, and marked luminal narrowing. Small bowel just proximal to this segment is dilated and shows a small bowel feces sign consistent with intraluminal stasis. The abnormal distal ileum shows an ill-defined wall with mild thickening and subtle interloop mesenteric fluid. Together, imaging features are highly suspicious for inflammatory stricture and compatible with inflammatory bowel disease. No evidence for perforation, fistula, or abscess at this time. 2. Mild lymphadenopathy in the ileocolic mesentery. I personally called Dr. Loreta Ave immediately after study interpretation to discuss these results. Electronically Signed: By: Kennith Center M.D. 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 Will await recs from GI. Pt had a BM. Can advance diet as tolerated but would wait and leave on fulls incase GI wants to do colonscopy.    LOS: 2 days    Jerre Simon , [facility] Surgery 09/14/2016, 10:35  AM Pager: [phone] Consults: [phone] Mon-Fri 7:00 am-4:30 pm Sat-Sun 7:00 am-11:30 am

## 2016-09-14 NOTE — Progress Notes (Addendum)
PROGRESS NOTE    Stephanie Ware  ZOX:[id number] DOB: 1990/08/18 DOA: 09/12/2016 PCP: Richmond Campbell., PA-C    Brief Narrative: Stephanie Ware is a 26 year old female with past medical history of headaches and small bowel obstruction, last hospitalized in April. She was seen by GI specialist Dr. 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 GI is following and suspect associated Chron's disease.  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 She is passing flatus and had 1 bowel movement this morning. - toleartingl liquid diet and wants to eat. Evaluated by GI and advance diet to regular today. Discontinue IV fluid. - Continue Protonix and Solumedrol iv, plan to discharge with oral prednisone tomorrow if patient continues to improve. Patient will follow-up with GI outpatient. - NG tube not needed at this time  # Leukocytosis: Patient remains afebrile and asyptomatic.   DVT prophylaxis: Patient is ambulating therefore discontinue sq heparin. Code Status: full Family Communication: Discussed with the patient's mother over the phone yesterday. Disposition Plan: Inpatient for now, Home likely tomorrow  Consultants:   [facility] Surgery  Gastroenterology  Procedures:   None  Antimicrobials:  None   Subjective: Patient ambulating by herself upon our arrival. She is in good spirits and is wanting to advance her diet. She has no complaints at this time and is feeling much better. Denied nausea, vomiting, abdominal pain. Reported passing stool.  Objective: Vitals:   09/13/16 0441 09/13/16 1356 09/13/16 2051 09/14/16 0521  BP: (!) 106/51 (!) 105/50 111/64 (!) 101/50  Pulse: (!) 57 61 81 (!) 51  Resp: 18 18 18 18   Temp: 98.6 F  (37 C) 98 F (36.7 C) 97.7 F (36.5 C) 97.9 F (36.6 C)  TempSrc: Oral Oral Oral Oral  SpO2: 99% 100% 100% 99%  Weight:      Height:        Intake/Output Summary (Last 24 hours) at 09/14/16 1020 Last data filed at 09/14/16 0610  Gross per 24 hour  Intake          2076.67 ml  Output             1050 ml  Net          1026.67 ml   Filed Weights   09/12/16 1632  Weight: 70.3 kg (154 lb 14.4 oz)    Examination:  General exam: Appears calm and comfortable  Respiratory system: Clear to auscultation. Respiratory effort normal. Cardiovascular system: S1 & S2 heard, RRR. No JVD, murmurs, rubs, gallops or clicks. No pedal edema. Gastrointestinal system: Abdomen is nondistended, soft and nontender. Normal bowel sounds heard. Central nervous system: Alert and oriented. No focal neurological deficits. Skin: No rashes, lesions or ulcers Psychiatry: Judgement and insight appear normal. Mood & affect appropriate.   Data Reviewed: I have personally reviewed following labs and imaging studies  CBC:  Recent Labs Lab 09/12/16 1809 09/13/16 0348  WBC 17.0* 12.6*  NEUTROABS 14.9*  --   HGB 15.1* 12.7  HCT 44.9 38.4  MCV 88.7 89.5  PLT 371 340   Basic Metabolic Panel:  Recent Labs Lab 09/12/16 1809 09/13/16 0348  NA 134* 135  K 3.8 4.0  CL 101 104  CO2 20* 22  GLUCOSE 111* 172*  BUN 7 6  CREATININE 0.65 0.58  CALCIUM 9.4 8.4*  MG 1.7  --   PHOS 3.6  --    GFR: Estimated Creatinine Clearance: 101.5 mL/min (by C-G formula based on SCr of 0.58 mg/dL). Liver Function Tests:  Recent Labs Lab 09/12/16 1809  AST 26  ALT 28  ALKPHOS 62  BILITOT 0.9  PROT 8.0  ALBUMIN 4.2   No results for input(s): LIPASE, AMYLASE in the last 168 hours. No results for input(s): AMMONIA in the last 168 hours. Coagulation Profile: No results for input(s): INR, PROTIME in the last 168 hours. Cardiac Enzymes: No results for input(s): CKTOTAL, CKMB, CKMBINDEX, TROPONINI in the last 168  hours. BNP (last 3 results) No results for input(s): PROBNP in the last 8760 hours. HbA1C: No results for input(s): HGBA1C in the last 72 hours. CBG: No results for input(s): GLUCAP in the last 168 hours. Lipid Profile: No results for input(s): CHOL, HDL, LDLCALC, TRIG, CHOLHDL, LDLDIRECT in the last 72 hours. Thyroid Function Tests:  Recent Labs  09/12/16 1809  TSH 3.137   Anemia Panel: No results for input(s): VITAMINB12, FOLATE, FERRITIN, TIBC, IRON, RETICCTPCT in the last 72 hours. Sepsis Labs: No results for input(s): PROCALCITON, LATICACIDVEN in the last 168 hours.  No results found for this or any previous visit (from the past 240 hour(s)).       Radiology Studies: Ct Entero Abd/pelvis W Contast  Addendum Date: 09/12/2016   ADDENDUM REPORT: 09/12/2016 15:24 ADDENDUM: Immediately proximal to the area of abrupt small bowel narrowing in the distal ileum is a 3.8 cm diverticular like outpouching from the small bowel (seen on image 127 of series 3 and well demonstrated on coronal image 35 of series 601). 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 No other diverticular disease is noted in the small bowel or colon. I discussed these findings with the patient who reports that she has had recurrent bouts of these symptoms since 2016. 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 In the setting of recurrent stricturing/chronic obstruction, perhaps the focal diverticular type structure just proximal to this stricture is secondary. Electronically Signed   By: Kennith Center M.D.   On: 09/12/2016 15:24    Result Date: 09/12/2016 CLINICAL DATA:  History of small-bowel obstruction. Periumbilical pain. EXAM: CT ABDOMEN AND PELVIS WITH CONTRAST (ENTEROGRAPHY) TECHNIQUE: Multidetector CT of the abdomen and pelvis during bolus administration of intravenous contrast. Negative oral contrast was given. CONTRAST:  ISOVUE-300 IOPAMIDOL (ISOVUE-300) INJECTION 61% COMPARISON:  07/12/2016 FINDINGS: The patient was unable to tolerate oral contrast on today's study, vomiting after consuming the second of 2 bottles. Lower chest:  Unremarkable. Hepatobiliary: No focal abnormality within the liver parenchyma. There is no evidence for gallstones, gallbladder wall thickening, or pericholecystic fluid. No intrahepatic or extrahepatic biliary dilation. Pancreas: No focal mass lesion. No dilatation of the main duct. No intraparenchymal cyst. No peripancreatic edema. Spleen: No splenomegaly. No focal mass lesion. Adrenals/Urinary Tract: No adrenal nodule or mass.Kidneys are unremarkable. No evidence for hydroureter. The urinary bladder appears normal for the degree of distention. Stomach/Bowel: Stomach is nondistended. No gastric wall thickening. No evidence of outlet obstruction. No abnormal enhancement in the  stomach. Duodenum is normal without wall thickening or abnormal enhancement. Jejunal loops are not well distended but show no abnormal enhancement. Mid and distal small bowel, mainly representing ileum is fluid-filled and distended measuring up to 3.6 cm diameter distally. At about 50 cm proximal to the ileocecal valve there is an abrupt transition from normal bowel wall to a long segment of luminal narrowing and inner wall hyperenhancement. The abnormal inner wall hyper enhancement luminal narrowing extends into the terminal ileum and the degree of inner wall/mucosal hyper enhancement is most prominent at the level of the terminal ileum. The ileum proximal to the long abnormal segment is dilated and demonstrates a small  bowel feces sign which is compatible with stasis. Together, these imaging features are highly suggestive of an inflammatory stricture. The appendix is normal.  The colon is normal. Vascular/Lymphatic: No abdominal aortic aneurysm. No abdominal aortic atherosclerotic calcification. There is no gastrohepatic or hepatoduodenal ligament lymphadenopathy. Mild lymphadenopathy noted in the ileocolic mesenteric, similar to prior. No pelvic sidewall lymphadenopathy. Reproductive: The uterus has normal CT imaging appearance. There is no adnexal mass. Other: Small volume intraperitoneal free fluid noted in the cul-de-sac. Musculoskeletal: Bone windows reveal no worrisome lytic or sclerotic osseous lesions. IMPRESSION: 1. Approximately 40- 50 cm segment of abnormal distal ileum extends into the terminal ileum. This segment shows inner wall hyper enhancement, most prominent in the terminal ileum, and marked luminal narrowing. Small bowel just proximal to this segment is dilated and shows a small bowel feces sign consistent with intraluminal stasis. The abnormal distal ileum shows an ill-defined wall with mild thickening and subtle interloop mesenteric fluid. Together, imaging features are highly suspicious for inflammatory stricture and compatible with inflammatory bowel disease. No evidence for perforation, fistula, or abscess at this time. 2. Mild lymphadenopathy in the ileocolic mesentery. I personally called Dr. Loreta Ave immediately after study interpretation to discuss these results. Electronically Signed: By: Kennith Center M.D. On: 09/12/2016 15:02        Scheduled Meds: . heparin  5,000 Units Subcutaneous Q8H  . methylPREDNISolone (SOLU-MEDROL) injection  40 mg Intravenous Q12H  . pantoprazole (PROTONIX) IV  40 mg Intravenous Q24H   Continuous Infusions: . dextrose 5 % and 0.9% NaCl 100 mL/hr at 09/14/16 0959     LOS: 2 days    Time spent: 25 min.    Willa Frater, PA-S [facility] Pager  [phone]  If 7PM-7AM, please contact night-coverage [email or web address] Password [email or web address] 09/14/2016, 10:20 AM

## 2016-09-15 MED ORDER — PREDNISONE 10 MG PO TABS: 40.0000 mg | ORAL_TABLET | Freq: Every day | ORAL | 0 refills | Status: AC

## 2016-09-15 NOTE — Progress Notes (Signed)
  Subjective: Feels well.  On steroids. Tolerating regular diet Having small solid bowel movements No pain Anticipating discharge this afternoon  Objective: Vital signs in last 24 hours: Temp:  [97.3 F (36.3 C)-99.4 F (37.4 C)] 97.7 F (36.5 C) (06/09 0537) Pulse Rate:  [52-56] 56 (06/09 0537) Resp:  [18] 18 (06/09 0537) BP: (111-123)/(52-68) 114/52 (06/09 0537) SpO2:  [100 %] 100 % (06/09 0537) Last BM Date: 09/14/16  Intake/Output from previous day: 06/08 0701 - 06/09 0700 In: 480 [P.O.:480] Out: -  Intake/Output this shift: Total I/O In: 240 [P.O.:240] Out: -   General appearance: Pleasant.  Cooperative.  No distress. Resp: clear to auscultation bilaterally GI: Soft.  Nontender.  Nondistended.  Bowel sounds normal.  No palpable mass.  Lab Results:   Recent Labs  09/12/16 1809 09/13/16 0348  WBC 17.0* 12.6*  HGB 15.1* 12.7  HCT 44.9 38.4  PLT 371 340   BMET  Recent Labs  09/12/16 1809 09/13/16 0348  NA 134* 135  K 3.8 4.0  CL 101 104  CO2 20* 22  GLUCOSE 111* 172*  BUN 7 6  CREATININE 0.65 0.58  CALCIUM 9.4 8.4*   PT/INR No results for input(s): LABPROT, INR in the last 72 hours. ABG No results for input(s): PHART, HCO3 in the last 72 hours.  Invalid input(s): PCO2, PO2  Studies/Results: No results found.  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 Now asymptomatic on steroids No indication for surgical intervention at this point Possibly discharge home today Agree with Dr. Hildred Mollica Pao plan for discharge home on prednisone, steroids to be tapered by Dr. Loreta Ave, schedule colonoscopy. Recommend follow-up with Dr. Lindie Spruce in about 6 weeks, sooner its obstructive symptoms intervene. If this is a fixed fibrous stenotic stricture she may require resection at some point down the road.   LOS: 3 days    Luca Burston M 09/15/2016

## 2016-09-15 NOTE — Discharge Summary (Signed)
Physician Discharge Summary  Stephanie Ware ZOX:[id number] DOB: 12-29-1990 DOA: 09/12/2016  PCP: Richmond Campbell., PA-C  Admit date: 09/12/2016 Discharge date: 09/15/2016  Admitted From:home Disposition:home  Recommendations for Outpatient Follow-up:  1. Follow up with PCP and GI in 1-2 weeks 2. Please obtain BMP/CBC in one week  Home Health:no Equipment/Devices:no Discharge Condition:no CODE STATUS:full Diet recommendation:heart healthy  Brief/Interim Summary: 26 year old female with past medical history of headaches and small bowel obstruction, last hospitalized in April. She was seen by GI specialist Dr. 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 Treated with steroids and supportive care. GI is following and suspect  Chron's disease.  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 Patient was treated with IV Solu-Medrol and IV fluid. Clinically improved. GI suspecting possible Crohn's disease. Patient has no nausea, vomiting, abdominal pain. She is able to tolerate diet well. She is ambulating without difficulties. She is passing  stool. Abdomen exam is normal. GI recommended to discharge with oral prednisone with close follow-up with GI and PCP.  The patient verbalizes understanding that she needs to follow-up with GI and general surgery. She is clinically improved. Leukocytosis likely in the setting of stress. She is afebrile and clinically improved on discharge.  Discharge Diagnoses:  Active Problems:   SBO (small bowel obstruction) (HCC)   Nausea & vomiting    Discharge Instructions  Discharge Instructions    Call MD for:  difficulty breathing, headache or visual disturbances    Complete by:  As directed    Call MD for:  extreme fatigue    Complete by:  As directed    Call MD for:  hives    Complete  by:  As directed    Call MD for:  persistant dizziness or light-headedness    Complete by:  As directed    Call MD for:  persistant nausea and vomiting    Complete by:  As directed    Call MD for:  severe uncontrolled pain    Complete by:  As directed    Call MD for:  temperature >100.4    Complete by:  As directed    Diet - low sodium heart healthy    Complete by:  As directed    Increase activity slowly    Complete by:  As directed      Allergies as of 09/15/2016      Reactions   Morphine And Related Hives      Medication List    STOP taking these medications   HYDROcodone-acetaminophen 5-325 MG tablet Commonly known as:  NORCO/VICODIN   sucralfate 1 g tablet Commonly known as:  CARAFATE     TAKE these medications   BLISOVI 24 FE 1-20 MG-MCG(24) tablet Generic drug:  Norethindrone Acetate-Ethinyl Estrad-FE Take 1 tablet by mouth daily.   CLARITIN 10 MG Caps Generic drug:  Loratadine Take 10 mg by mouth daily.   Fish Oil 1000 MG Cpdr Take 1,000 mg by mouth daily.   omeprazole 40 MG capsule Commonly known as:  PRILOSEC Take 40 mg by mouth daily.   ondansetron 8 MG disintegrating tablet Commonly known as:  ZOFRAN ODT Take 1 tablet (8 mg total) by mouth every 8 (eight) hours as needed for nausea or vomiting.   predniSONE 10 MG tablet Commonly known as:  DELTASONE Take 4 tablets (40 mg total) by  mouth daily. Please follow up with Dr. Loreta Ave from GI for tapering the dose.      Follow-up Information    Jimmye Norman, MD. Schedule an appointment as soon as possible for a visit in 3 week(s).   Specialty:  General Surgery Contact information: [address] [place] [address] [phone]        Richmond Campbell., PA-C. Schedule an appointment as soon as possible for a visit in 1 week(s).   Specialty:  Family Medicine Contact information: [address] [place] [address] [phone]        Charna Elizabeth, MD. Schedule an appointment  as soon as possible for a visit in 1 week(s).   Specialty:  Gastroenterology Contact information: [address], [place] [address] [phone]          Allergies  Allergen Reactions  . Morphine And Related Hives    Consultations: General surgery GI  Procedures/Studies: None  Subjective: Seen and examined at bedside. Sitting on chair comfortably. Denied headache, dizziness, nausea, vomiting, chest pain, shortness of breath or abdominal pain. Passing stool normally. Tolerating diet well.  Discharge Exam: Vitals:   09/14/16 2139 09/15/16 0537  BP: 111/61 (!) 114/52  Pulse: (!) 52 (!) 56  Resp: 18 18  Temp: 97.3 F (36.3 C) 97.7 F (36.5 C)   Vitals:   09/14/16 0521 09/14/16 1303 09/14/16 2139 09/15/16 0537  BP: (!) 101/50 123/68 111/61 (!) 114/52  Pulse: (!) 51 (!) 52 (!) 52 (!) 56  Resp: 18  18 18   Temp: 97.9 F (36.6 C) 99.4 F (37.4 C) 97.3 F (36.3 C) 97.7 F (36.5 C)  TempSrc: Oral Oral Oral Oral  SpO2: 99% 100% 100% 100%  Weight:      Height:        General: Pt is alert, awake, not in acute distress Cardiovascular: RRR, S1/S2 +, no rubs, no gallops Respiratory: CTA bilaterally, no wheezing, no rhonchi Abdominal: Soft, NT, ND, bowel sounds + Extremities: no edema, no cyanosis    The results of significant diagnostics from this hospitalization (including imaging, microbiology, ancillary and laboratory) are listed below for reference.     Microbiology: No results found for this or any previous visit (from the past 240 hour(s)).   Labs: BNP (last 3 results) No results for input(s): BNP in the last 8760 hours. Basic Metabolic Panel:  Recent Labs Lab 09/12/16 1809 09/13/16 0348  NA 134* 135  K 3.8 4.0  CL 101 104  CO2 20* 22  GLUCOSE 111* 172*  BUN 7 6  CREATININE 0.65 0.58  CALCIUM 9.4 8.4*  MG 1.7  --   PHOS 3.6  --    Liver Function Tests:  Recent Labs Lab 09/12/16 1809  AST 26  ALT 28  ALKPHOS 62   BILITOT 0.9  PROT 8.0  ALBUMIN 4.2   No results for input(s): LIPASE, AMYLASE in the last 168 hours. No results for input(s): AMMONIA in the last 168 hours. CBC:  Recent Labs Lab 09/12/16 1809 09/13/16 0348  WBC 17.0* 12.6*  NEUTROABS 14.9*  --   HGB 15.1* 12.7  HCT 44.9 38.4  MCV 88.7 89.5  PLT 371 340   Cardiac Enzymes: No results for input(s): CKTOTAL, CKMB, CKMBINDEX, TROPONINI in the last 168 hours. BNP: Invalid input(s): POCBNP CBG: No results for input(s): GLUCAP in the last 168 hours. D-Dimer No results for input(s): DDIMER in the last 72 hours. Hgb A1c No results for  input(s): HGBA1C in the last 72 hours. Lipid Profile No results for input(s): CHOL, HDL, LDLCALC, TRIG, CHOLHDL, LDLDIRECT in the last 72 hours. Thyroid function studies  Recent Labs  09/12/16 1809  TSH 3.137   Anemia work up No results for input(s): VITAMINB12, FOLATE, FERRITIN, TIBC, IRON, RETICCTPCT in the last 72 hours. Urinalysis    Component Value Date/Time   COLORURINE YELLOW 07/12/2016 1019   APPEARANCEUR CLEAR 07/12/2016 1019   LABSPEC 1.027 07/12/2016 1019   PHURINE 5.0 07/12/2016 1019   GLUCOSEU NEGATIVE 07/12/2016 1019   HGBUR NEGATIVE 07/12/2016 1019   BILIRUBINUR NEGATIVE 07/12/2016 1019   KETONESUR 80 (A) 07/12/2016 1019   PROTEINUR NEGATIVE 07/12/2016 1019   UROBILINOGEN 0.2 12/10/2014 2041   NITRITE NEGATIVE 07/12/2016 1019   LEUKOCYTESUR NEGATIVE 07/12/2016 1019   Sepsis Labs Invalid input(s): PROCALCITONIN,  WBC,  LACTICIDVEN Microbiology No results found for this or any previous visit (from the past 240 hour(s)).   Time coordinating discharge: 27 minutes  SIGNED:   Maxie Barb, MD  [facility] 09/15/2016, 10:13 AM  If 7PM-7AM, please contact night-coverage [email or web address] Password [email or web address]

## 2016-09-15 NOTE — Progress Notes (Signed)
Pt ready for DC to home accomp by family.  Follow up visits reviewed with pt and rx given for prednisone and when to start taking it.  No further questions verbalized about home self care.

## 2018-07-11 IMAGING — DX DG ABD PORTABLE 1V
2 series · 2 of 2 positions shown · non-contrast
Comparison: Plain film of the abdomen dated 07/13/2016 and CT
abdomen dated 07/12/2016.

CLINICAL DATA: Small-bowel obstruction.

EXAM:
PORTABLE ABDOMEN - 1 VIEW

[abdomen kub (1 of 2)]
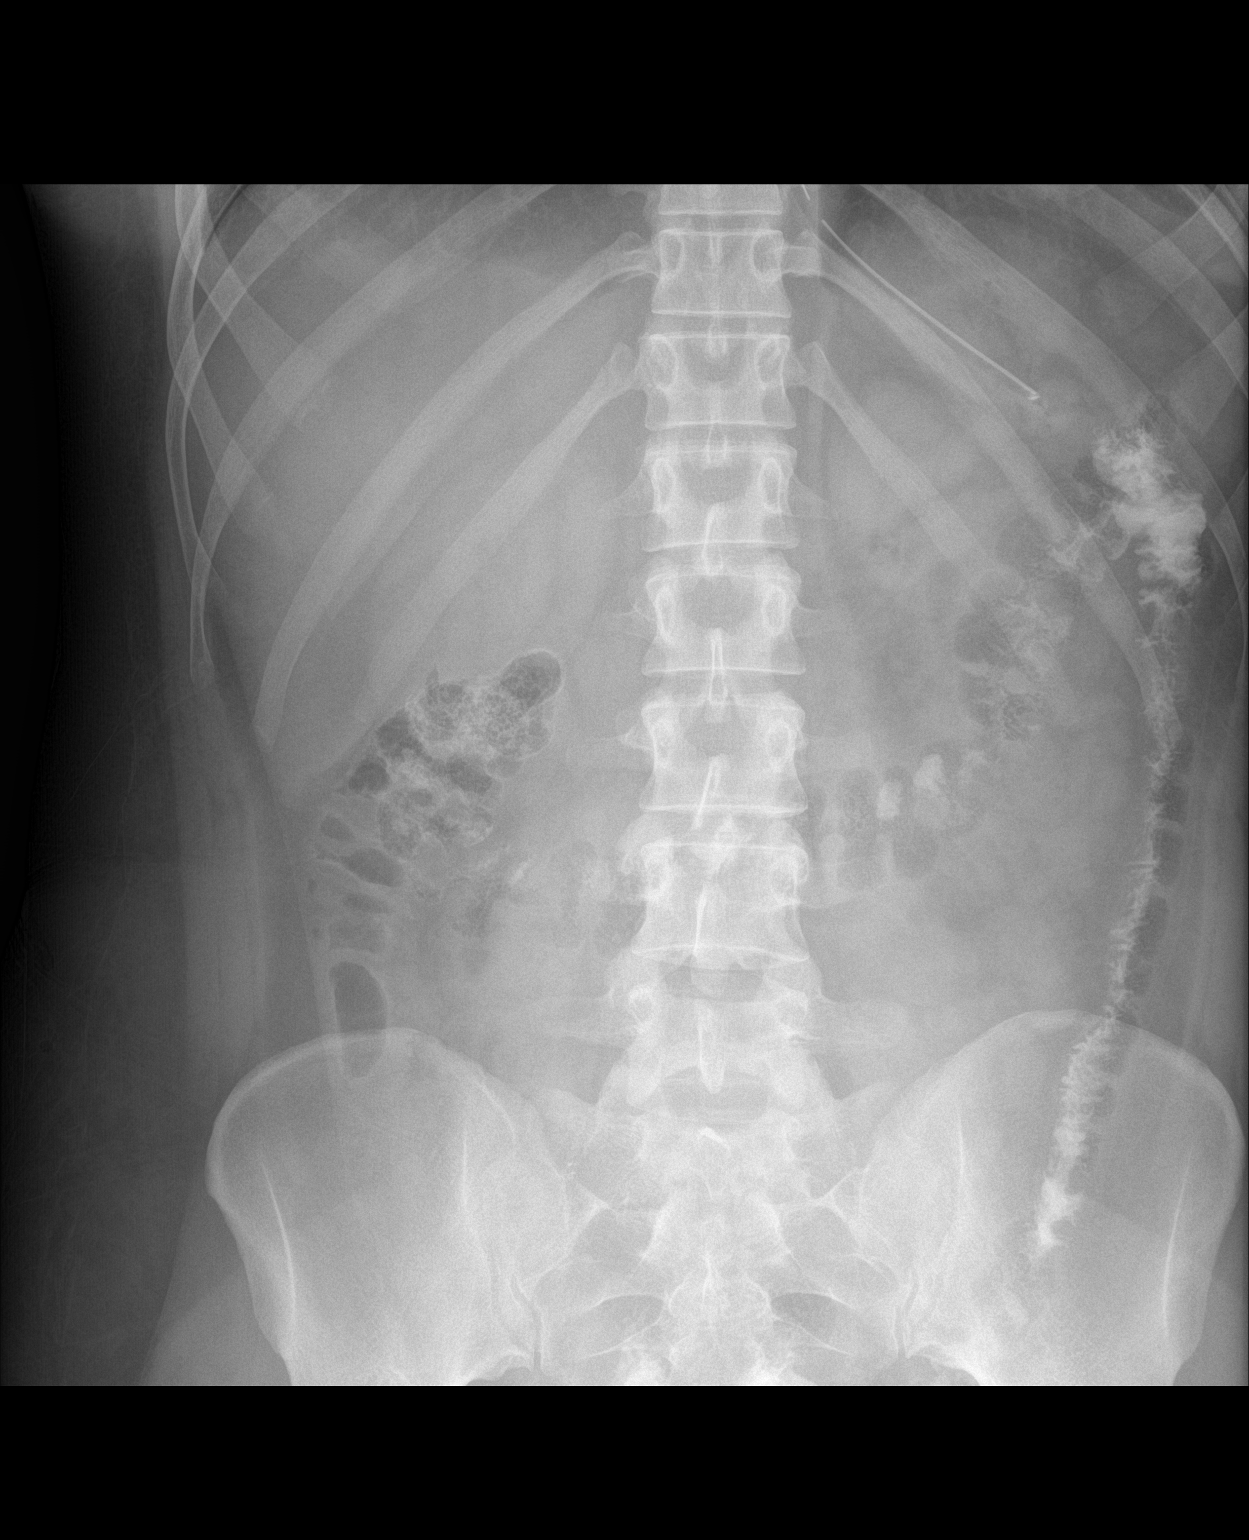

[abdomen kub (2 of 2)]
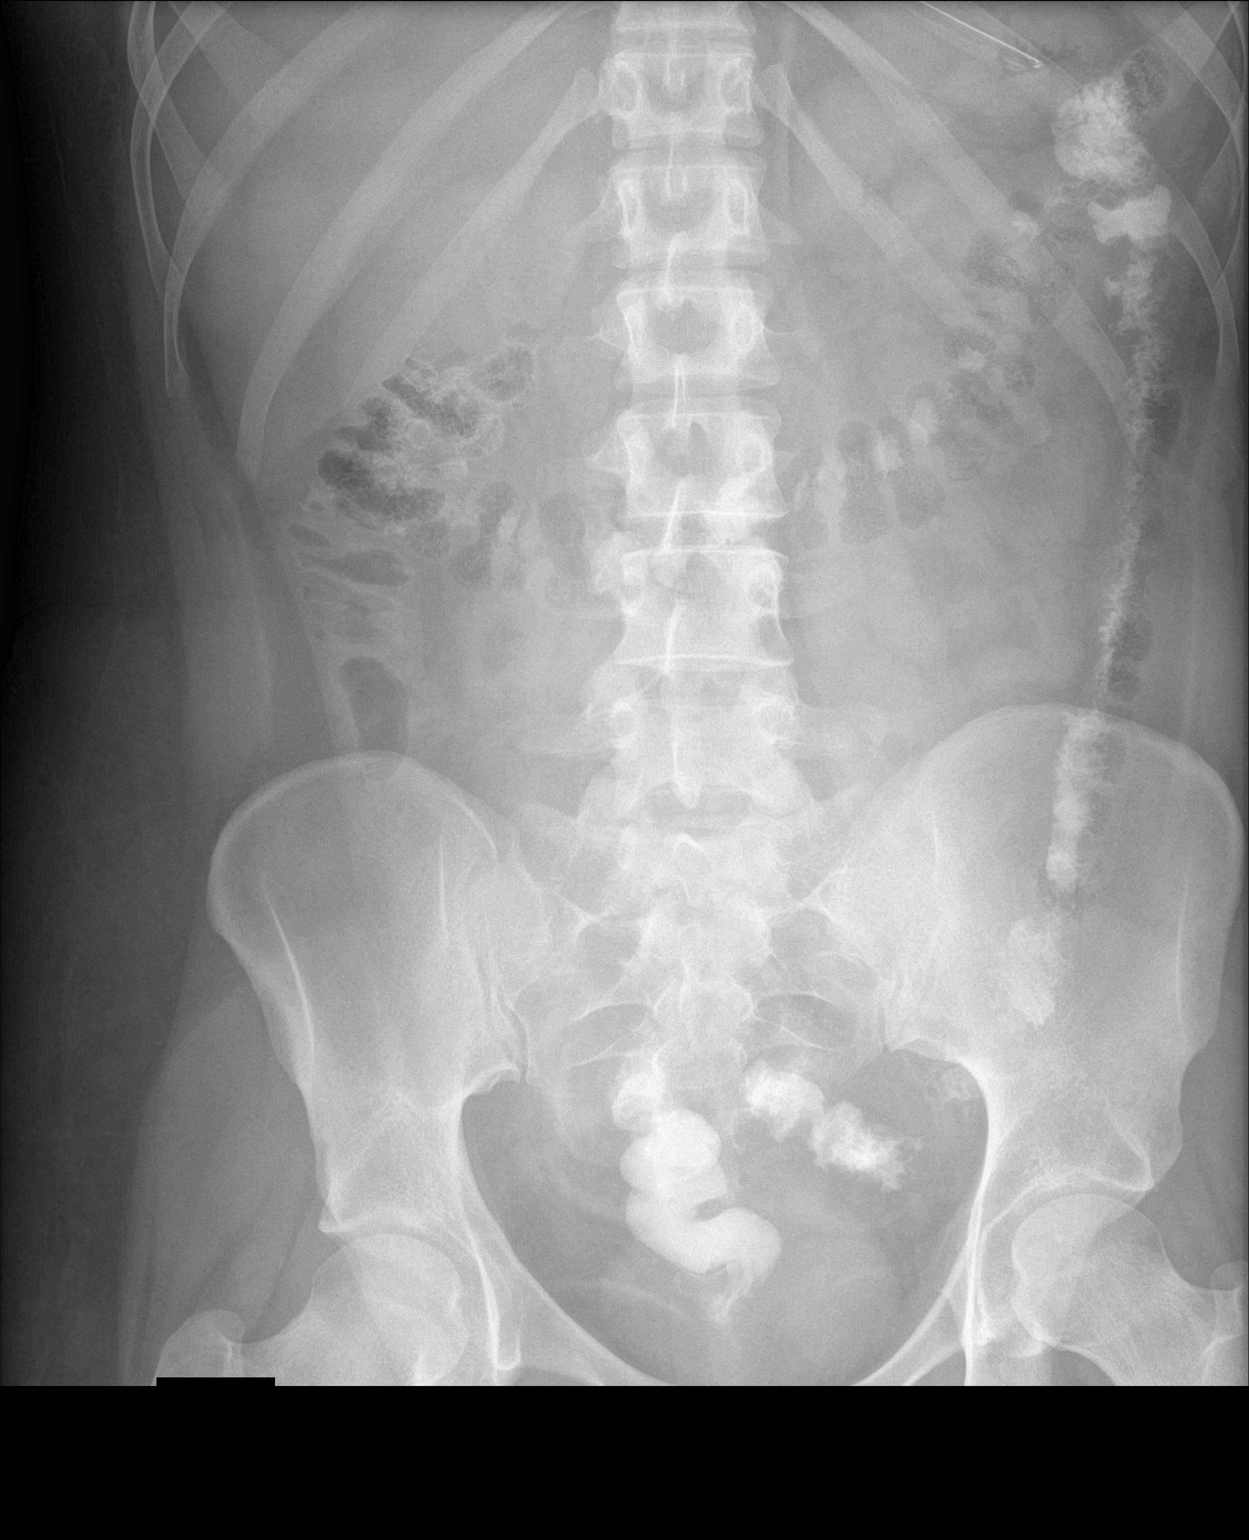

[2 of 2 positions shown; findings below may reference images not displayed]

FINDINGS: Enteric tube is stable in position with tip in the stomach. Enteric
contrast again seen within the colon. There are no dilated small
bowel loops appreciated on today's exam. No evidence of free
intraperitoneal air. Lung bases are clear.
IMPRESSION: No radiographic evidence of small bowel obstruction on today's exam.

## 2018-09-13 ENCOUNTER — Telehealth: Payer: Self-pay | Admitting: Gastroenterology

## 2018-09-13 NOTE — Telephone Encounter (Signed)
Patient called in this evening. She has had some lower abdominal discomfort for the past 24 hours. She had a surgery at another hospital for small bowel obstruction in recent weeks, she is unclear if this was due to Crohn's disease or Meckel's diverticulum, no records available, she does not recall what the issue was. She states she does have Crohn's for which she takes Azathioprine. She is tolerating liquids, has no fevers, no vomiting, and sounds stable over the phone, pain is not severe. Unclear if this is due to underlying Crohn's or post-operative issue. Agree with staying on liquid diet right now and see how she does. If she has inability to tolerate PO, worsening pain, fevers, etc, she needs to come to the ED for evaluation. Otherwise if her symptoms persist she will need labs and further evaluation. I will let Dr. Collene Mares know and have her office call her on Monday (it is currently Sat night). Patient agreed, we are here at the hospital this weekend if she needs [place] and worsens.

## 2018-09-15 ENCOUNTER — Ambulatory Visit
Admission: RE | Admit: 2018-09-15 | Discharge: 2018-09-15 | Disposition: A | Discharge status: Home / Self Care | Payer: No Typology Code available for payment source | Source: Ambulatory Visit | Attending: Gastroenterology | Admitting: Gastroenterology

## 2018-09-15 ENCOUNTER — Other Ambulatory Visit: Payer: Self-pay | Admitting: Gastroenterology

## 2018-09-15 ENCOUNTER — Other Ambulatory Visit: Payer: Self-pay

## 2018-09-15 DIAGNOSIS — R52 Pain, unspecified: Secondary | ICD-10-CM

## 2018-09-15 MED ORDER — IOPAMIDOL (ISOVUE-300) INJECTION 61%
100.0000 mL | Freq: Once | INTRAVENOUS | Status: AC | PRN
  Administered 2018-09-15: 100 mL via INTRAVENOUS

## 2020-09-11 IMAGING — CT CT ABDOMEN AND PELVIS WITH CONTRAST
1 of 2 series · 14 of 32 positions shown, 18 images · IV contrast (APPLIED)
Comparison: CT scan [DATE]

CLINICAL DATA: Right lower quadrant abdominal pain and chills for 3
days. History of recent exploratory laparotomy for small bowel
obstruction on [REDACTED]. History of Crohn's disease.

EXAM:
CT ABDOMEN AND PELVIS WITH CONTRAST
TECHNIQUE: Multidetector CT imaging of the abdomen and pelvis was performed
using the standard protocol following bolus administration of
intravenous contrast.
CONTRAST:  100mL [id number] IOPAMIDOL ([id number]) INJECTION 61%

[Series 2: abd/pelvis w/cm · axial · 0.76mm/px · z∈[-414,-14]mm · 14 of 90 slices shown, 18 images]
[im 5/90  soft-tissue]
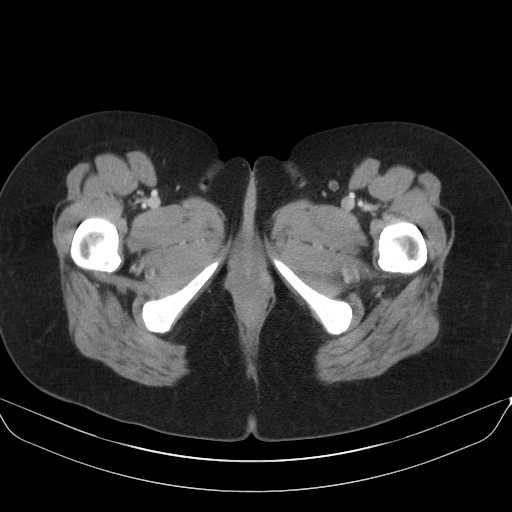
[im 5/90  bone]
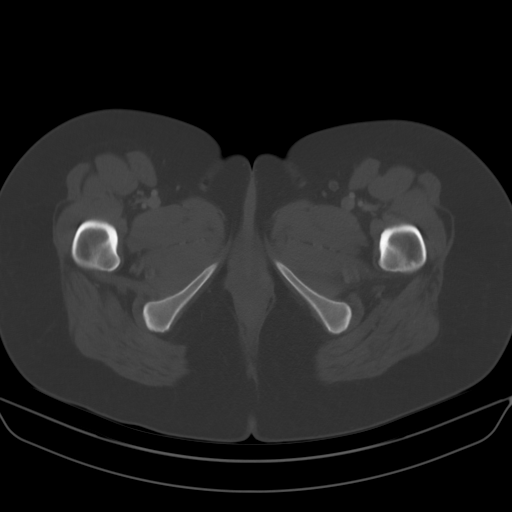
[im 13/90  soft-tissue]
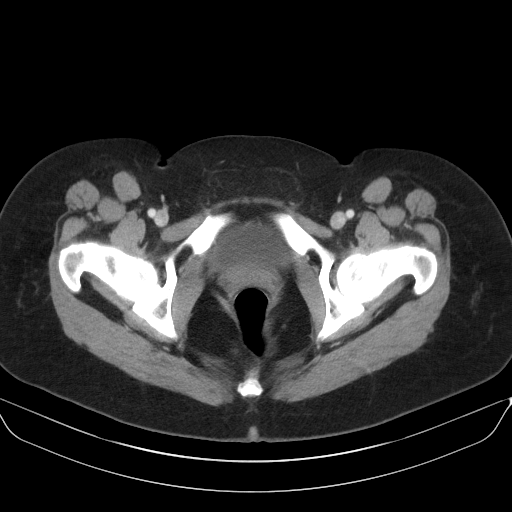
[im 22/90  soft-tissue]
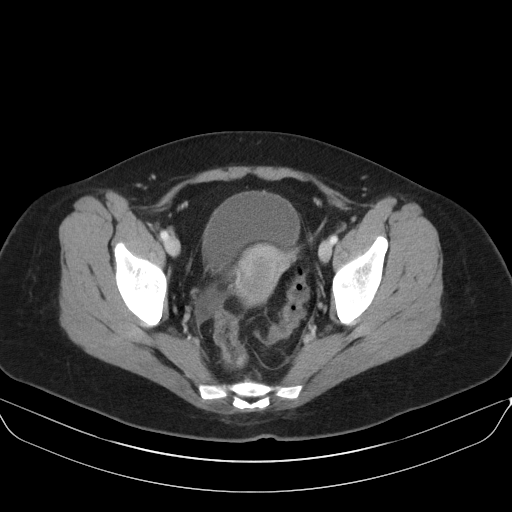
[im 26/90  soft-tissue]
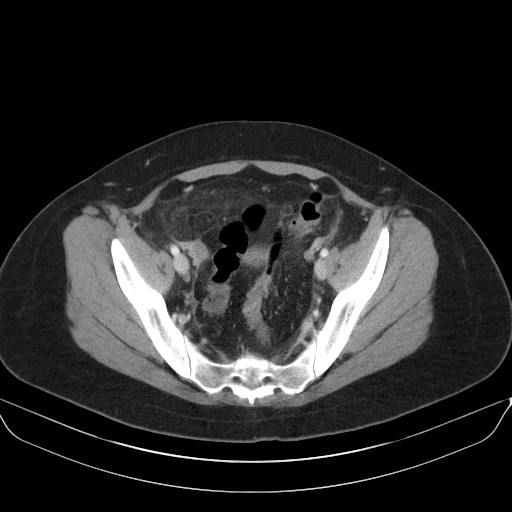
[im 34/90  soft-tissue]
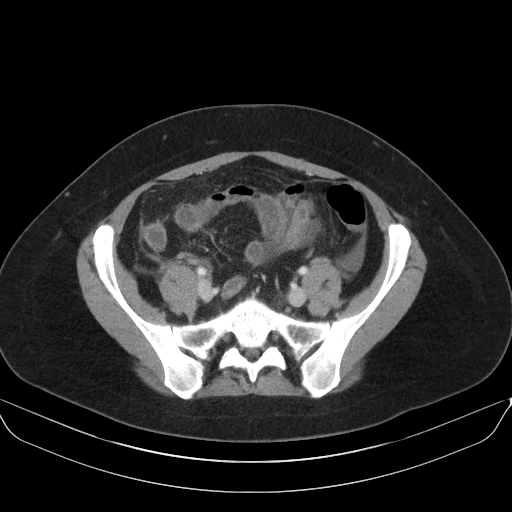
[im 43/90  soft-tissue]
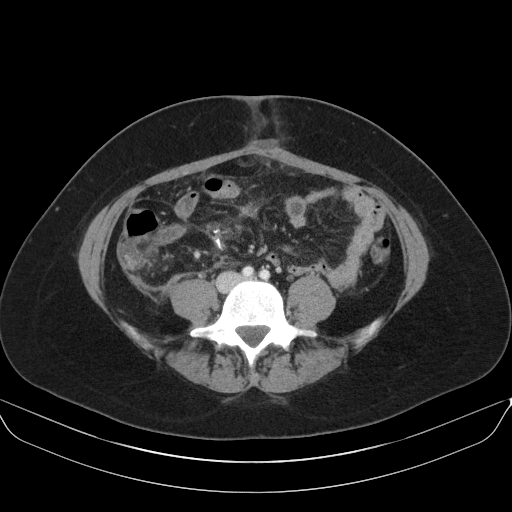
[im 47/90  soft-tissue]
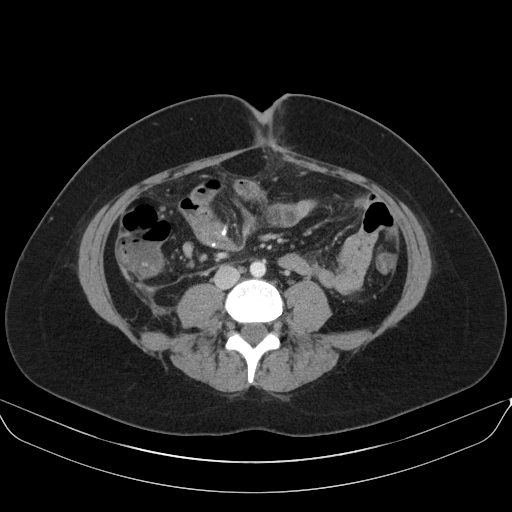
[im 56/90  soft-tissue]
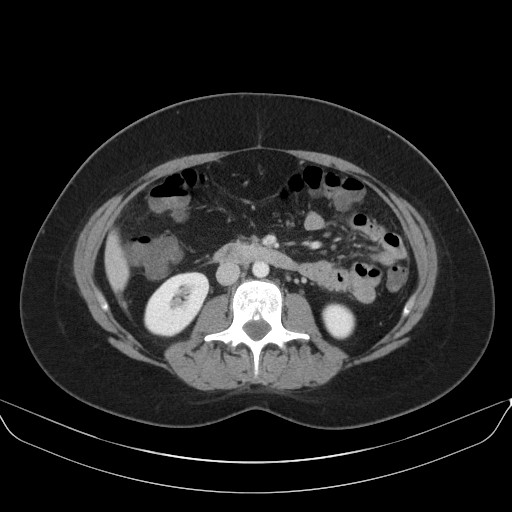
[im 64/90  soft-tissue]
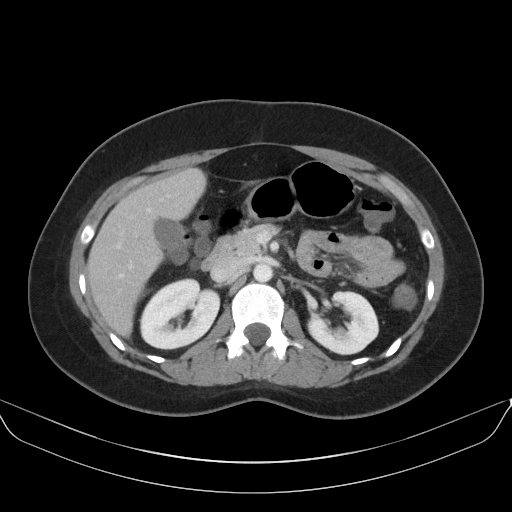
[im 64/90  bone]
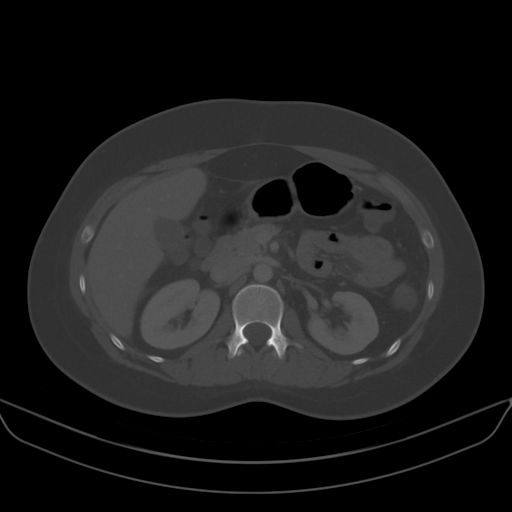
[im 68/90  soft-tissue]
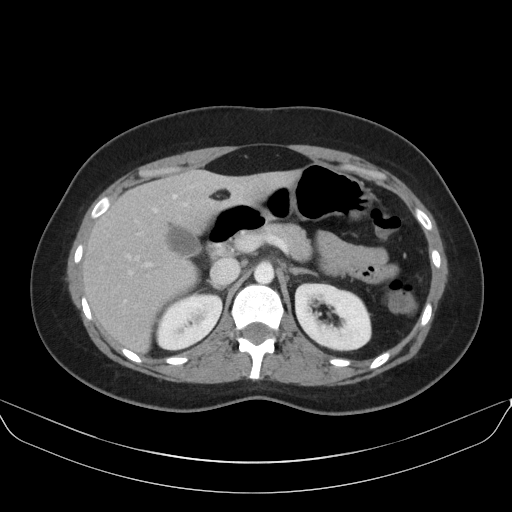
[im 73/90  lung]
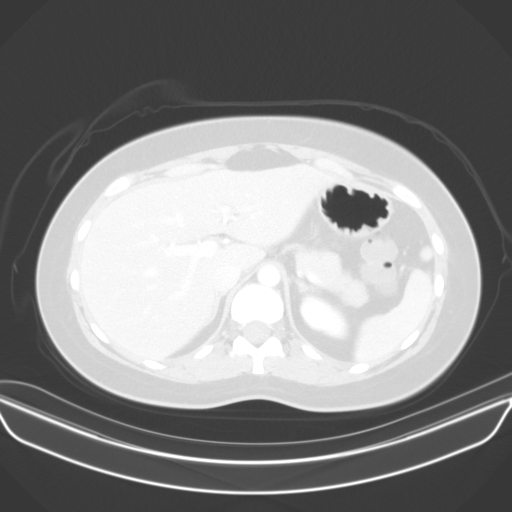
[im 77/90  soft-tissue]
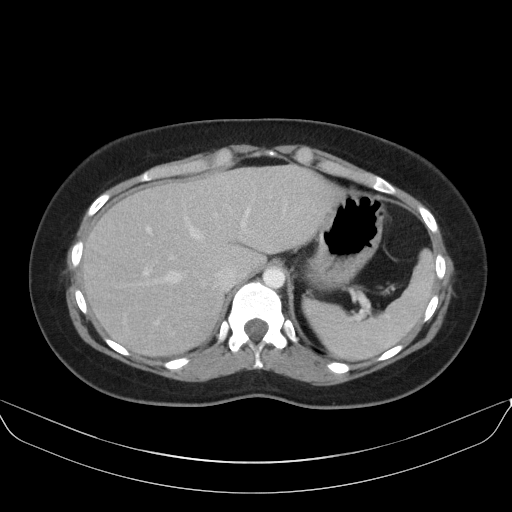
[im 77/90  lung]
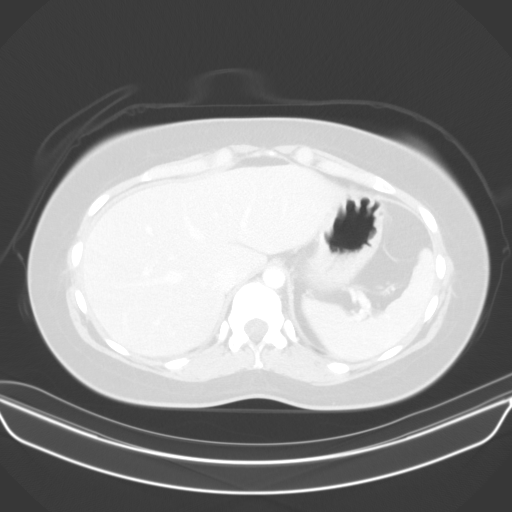
[im 81/90  lung]
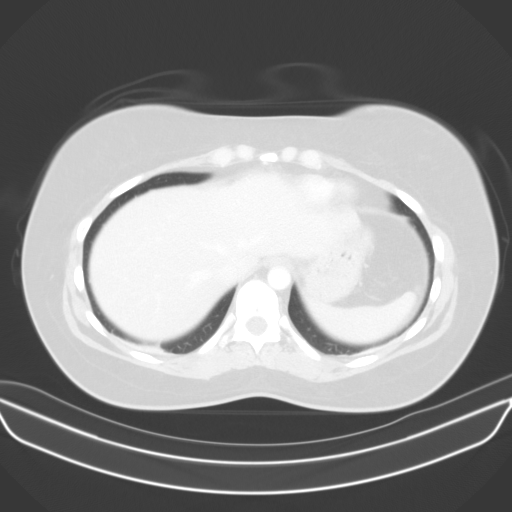
[im 85/90  soft-tissue]
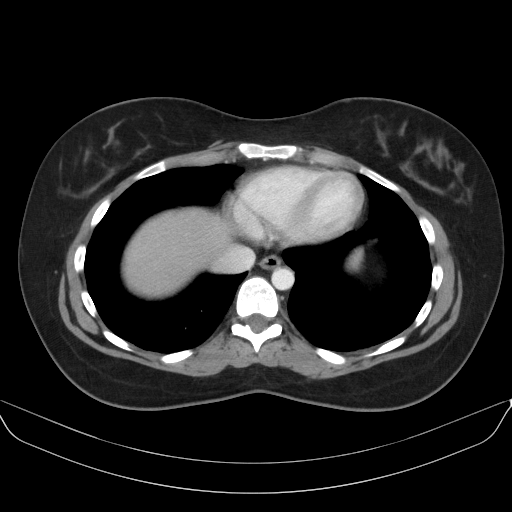
[im 85/90  lung]
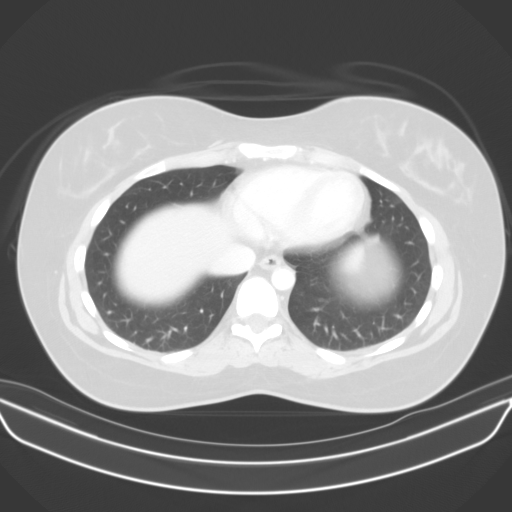

[14 of 32 positions shown; findings below may reference images not displayed]

FINDINGS: Lower chest: The lung bases are clear of an acute process. No
pulmonary nodules or pleural effusions.

Hepatobiliary: No focal hepatic lesions or intrahepatic biliary
dilatation. The gallbladder is grossly normal. No common bile duct
dilatation.

Pancreas: No mass, inflammation or ductal dilatation.

Spleen: Normal size.  No focal lesions.

Adrenals/Urinary Tract: The adrenal glands and kidneys are normal.
The bladder is normal.

Stomach/Bowel: The stomach, duodenum and small bowel are
unremarkable. There are surgical changes involving the mid small
bowel likely from a bowel resection. I do not see any complicating
features or obstructive findings. No evidence of free air. The colon
is unremarkable. The terminal ileum is normal.

Vascular/Lymphatic: The aorta and branch vessels are normal. The
major venous structures are patent. There are scattered small
mesenteric and retroperitoneal lymph nodes with a cluster of
borderline nodes in the ileocolic region.

There is mild mesenteric edema and a small amount of fluid in the
abdomen and pelvis which may be postoperative change. I do not see a
discrete rim enhancing fluid collection to suggest a postoperative
abscess. No obvious enhancing peritoneum to suggest peritonitis.

Reproductive: The uterus and ovaries are unremarkable.

Other: Some interstitial/inflammatory changes around the midline
abdominal incision but no focal fluid collection to suggest an
abscess.

Musculoskeletal: No significant bony findings.
IMPRESSION: 1. Postoperative changes involving the small bowel but no
complicating features such as perforation or obstruction.
2. Normal CT appearance of the distal and terminal ileum. No
definite findings for active Crohn's disease.
3. Mild mesenteric edema and a small amount of free abdominal/pelvic
fluid. This is likely postoperative. No CT findings suspicious for a
discrete drainable abscess or peritonitis.
4. Interstitial changes around the anterior abdominal wall incision
site but I do not see a discrete abscess.
5. No significant pulmonary findings.
